# Patient Record
Sex: Female | Born: 1985 | Marital: Single | Smoking: Never smoker
Health system: Southern US, Community
[De-identification: ages and names within clinical notes are randomized; demographics above are authoritative.]

## PROBLEM LIST (undated history)

## (undated) ENCOUNTER — Inpatient Hospital Stay (HOSPITAL_COMMUNITY): Payer: Self-pay

## (undated) DIAGNOSIS — I10 Essential (primary) hypertension: Secondary | ICD-10-CM

## (undated) DIAGNOSIS — F419 Anxiety disorder, unspecified: Secondary | ICD-10-CM

## (undated) DIAGNOSIS — Z789 Other specified health status: Secondary | ICD-10-CM

## (undated) HISTORY — DX: Anxiety disorder, unspecified: F41.9

## (undated) HISTORY — PX: NO PAST SURGERIES: SHX2092

---

## 2016-08-22 LAB — OB RESULTS CONSOLE RPR: RPR: NONREACTIVE

## 2016-08-22 LAB — OB RESULTS CONSOLE GC/CHLAMYDIA
CHLAMYDIA, DNA PROBE: NEGATIVE
Gonorrhea: NEGATIVE

## 2016-08-22 LAB — OB RESULTS CONSOLE HEPATITIS B SURFACE ANTIGEN: HEP B S AG: NEGATIVE

## 2016-08-22 LAB — OB RESULTS CONSOLE HIV ANTIBODY (ROUTINE TESTING): HIV: NONREACTIVE

## 2016-08-22 LAB — OB RESULTS CONSOLE RUBELLA ANTIBODY, IGM: RUBELLA: IMMUNE

## 2016-12-09 ENCOUNTER — Encounter (HOSPITAL_COMMUNITY): Payer: Self-pay

## 2016-12-09 ENCOUNTER — Other Ambulatory Visit: Payer: Self-pay

## 2016-12-09 ENCOUNTER — Observation Stay (HOSPITAL_COMMUNITY)
Admission: AD | Admit: 2016-12-09 | Discharge: 2016-12-10 | Disposition: A | Discharge status: Home / Self Care | Payer: Commercial Managed Care - PPO | Source: Ambulatory Visit | Attending: Obstetrics and Gynecology | Admitting: Obstetrics and Gynecology

## 2016-12-09 DIAGNOSIS — O4702 False labor before 37 completed weeks of gestation, second trimester: Secondary | ICD-10-CM | POA: Diagnosis not present

## 2016-12-09 DIAGNOSIS — O2692 Pregnancy related conditions, unspecified, second trimester: Secondary | ICD-10-CM | POA: Diagnosis present

## 2016-12-09 DIAGNOSIS — Z8249 Family history of ischemic heart disease and other diseases of the circulatory system: Secondary | ICD-10-CM | POA: Diagnosis not present

## 2016-12-09 DIAGNOSIS — O26892 Other specified pregnancy related conditions, second trimester: Principal | ICD-10-CM | POA: Insufficient documentation

## 2016-12-09 DIAGNOSIS — Z833 Family history of diabetes mellitus: Secondary | ICD-10-CM | POA: Diagnosis not present

## 2016-12-09 DIAGNOSIS — Z3A23 23 weeks gestation of pregnancy: Secondary | ICD-10-CM | POA: Diagnosis not present

## 2016-12-09 HISTORY — DX: Other specified health status: Z78.9

## 2016-12-09 LAB — URINALYSIS, ROUTINE W REFLEX MICROSCOPIC
BILIRUBIN URINE: NEGATIVE
Glucose, UA: NEGATIVE mg/dL
HGB URINE DIPSTICK: NEGATIVE
KETONES UR: NEGATIVE mg/dL
NITRITE: NEGATIVE
PH: 6 (ref 5.0–8.0)
Protein, ur: NEGATIVE mg/dL
SPECIFIC GRAVITY, URINE: 1.014 (ref 1.005–1.030)
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
## 2016-12-10 ENCOUNTER — Observation Stay (HOSPITAL_COMMUNITY): Payer: Commercial Managed Care - PPO

## 2016-12-10 ENCOUNTER — Encounter (HOSPITAL_COMMUNITY): Payer: Self-pay | Admitting: *Deleted

## 2016-12-10 DIAGNOSIS — O4702 False labor before 37 completed weeks of gestation, second trimester: Secondary | ICD-10-CM | POA: Diagnosis not present

## 2016-12-10 DIAGNOSIS — O26892 Other specified pregnancy related conditions, second trimester: Secondary | ICD-10-CM | POA: Diagnosis not present

## 2016-12-10 MED ORDER — PRENATAL MULTIVITAMIN CH: 1.0000 | ORAL_TABLET | Freq: Every day | ORAL | Status: DC

## 2016-12-10 MED ORDER — DOCUSATE SODIUM 100 MG PO CAPS: 100.0000 mg | ORAL_CAPSULE | Freq: Every day | ORAL | Status: DC

## 2016-12-10 MED ORDER — ZOLPIDEM TARTRATE 5 MG PO TABS: 5.0000 mg | ORAL_TABLET | Freq: Every evening | ORAL | Status: DC | PRN

## 2016-12-10 MED ORDER — ACETAMINOPHEN 325 MG PO TABS: 650.0000 mg | ORAL_TABLET | ORAL | Status: DC | PRN

## 2016-12-10 MED ORDER — CALCIUM CARBONATE ANTACID 500 MG PO CHEW: 2.0000 | CHEWABLE_TABLET | ORAL | Status: DC | PRN

## 2016-12-10 NOTE — Progress Notes (Addendum)
   12/10/16 0236  Vital Signs  BP 97/70  BP Location Left Arm  Patient Position (if appropriate) Lying  BP Method Automatic  Pulse Rate 99  Pulse Rate Source Dinamap  Resp 18  Temp 97.9 F (36.6 C)  Temp Source Oral  Oxygen Therapy  SpO2 100 %  O2 Device Room Air     12/10/16 0236  Vital Signs  BP 97/70  BP Location Left Arm  Patient Position (if appropriate) Lying  BP Method Automatic  Pulse Rate 99  Pulse Rate Source Dinamap  Resp 18  Temp 97.9 F (36.6 C)  Temp Source Oral  Oxygen Therapy  SpO2 100 %  O2 Device Room Air  Received patient from MAU awake, alert and oriented x 4. Pt denies pain or discomfort. Pt oriented to room, call bell in close reach. Cont. EFM initiated.
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
## 2017-01-09 NOTE — L&D Delivery Note (Signed)
Delivery Note At 10:44 AM a viable female was delivered via  (Presentation:VTX?OA ;  ).  APGAR:9-9 , ; weight  .   Placenta statusspont, intact: , .  Cord:  with the following complications: .  Cord pH: not sent  Anesthesia:  loc Episiotomy:none   Lacerations:  R superficial periurethral Suture Repair: 3.0 vicryl rapide Est. Blood Loss (mL):  250  Mom to postpartum.  Baby to Couplet care / Skin to Skin.  [place] 03/14/2017, 11:00 AM

## 2017-03-06 LAB — OB RESULTS CONSOLE GBS: GBS: POSITIVE

## 2017-03-08 ENCOUNTER — Encounter (HOSPITAL_COMMUNITY): Payer: Self-pay | Admitting: Anesthesiology

## 2017-03-08 ENCOUNTER — Other Ambulatory Visit: Payer: Self-pay

## 2017-03-08 ENCOUNTER — Encounter (HOSPITAL_COMMUNITY): Payer: Self-pay

## 2017-03-08 ENCOUNTER — Inpatient Hospital Stay (HOSPITAL_COMMUNITY)
Admission: AD | Admit: 2017-03-08 | Discharge: 2017-03-10 | DRG: 833 | Disposition: A | Discharge status: Home / Self Care | Payer: Commercial Managed Care - PPO | Source: Ambulatory Visit | Attending: Obstetrics and Gynecology | Admitting: Obstetrics and Gynecology

## 2017-03-08 DIAGNOSIS — Z363 Encounter for antenatal screening for malformations: Secondary | ICD-10-CM

## 2017-03-08 DIAGNOSIS — O1490 Unspecified pre-eclampsia, unspecified trimester: Secondary | ICD-10-CM

## 2017-03-08 DIAGNOSIS — Z3A36 36 weeks gestation of pregnancy: Secondary | ICD-10-CM | POA: Diagnosis not present

## 2017-03-08 DIAGNOSIS — O1403 Mild to moderate pre-eclampsia, third trimester: Secondary | ICD-10-CM | POA: Diagnosis not present

## 2017-03-08 DIAGNOSIS — O1493 Unspecified pre-eclampsia, third trimester: Secondary | ICD-10-CM | POA: Diagnosis not present

## 2017-03-08 DIAGNOSIS — O99820 Streptococcus B carrier state complicating pregnancy: Secondary | ICD-10-CM | POA: Diagnosis present

## 2017-03-08 DIAGNOSIS — O09299 Supervision of pregnancy with other poor reproductive or obstetric history, unspecified trimester: Secondary | ICD-10-CM | POA: Diagnosis present

## 2017-03-08 HISTORY — DX: Supervision of pregnancy with other poor reproductive or obstetric history, unspecified trimester: O09.299

## 2017-03-08 LAB — COMPREHENSIVE METABOLIC PANEL
ALBUMIN: 2.9 g/dL — AB (ref 3.5–5.0)
ALT: 20 U/L (ref 14–54)
AST: 26 U/L (ref 15–41)
Alkaline Phosphatase: 190 U/L — ABNORMAL HIGH (ref 38–126)
Anion gap: 11 (ref 5–15)
BILIRUBIN TOTAL: 0.4 mg/dL (ref 0.3–1.2)
BUN: 8 mg/dL (ref 6–20)
CO2: 18 mmol/L — ABNORMAL LOW (ref 22–32)
CREATININE: 0.71 mg/dL (ref 0.44–1.00)
Calcium: 9.3 mg/dL (ref 8.9–10.3)
Chloride: 107 mmol/L (ref 101–111)
GFR calc Af Amer: >60 mL/min (ref >60)
GLUCOSE: 83 mg/dL (ref 65–99)
Potassium: 4 mmol/L (ref 3.5–5.1)
Sodium: 136 mmol/L (ref 135–145)
TOTAL PROTEIN: 6.9 g/dL (ref 6.5–8.1)

## 2017-03-08 LAB — URINALYSIS, ROUTINE W REFLEX MICROSCOPIC
Bilirubin Urine: NEGATIVE
Glucose, UA: NEGATIVE mg/dL
Hgb urine dipstick: NEGATIVE
Ketones, ur: NEGATIVE mg/dL
Leukocytes, UA: NEGATIVE
NITRITE: NEGATIVE
SPECIFIC GRAVITY, URINE: 1.026 (ref 1.005–1.030)
pH: 6 (ref 5.0–8.0)

## 2017-03-08 LAB — CBC
HCT: 33.8 % — ABNORMAL LOW (ref 36.0–46.0)
HEMOGLOBIN: 11.4 g/dL — AB (ref 12.0–15.0)
MCH: 27.8 pg (ref 26.0–34.0)
MCHC: 33.7 g/dL (ref 30.0–36.0)
MCV: 82.4 fL (ref 78.0–100.0)
Platelets: 197 10*3/uL (ref 150–400)
RBC: 4.1 MIL/uL (ref 3.87–5.11)
RDW: 13.8 % (ref 11.5–15.5)
WBC: 7.4 10*3/uL (ref 4.0–10.5)

## 2017-03-08 LAB — PROTEIN / CREATININE RATIO, URINE
CREATININE, URINE: 281 mg/dL
Protein Creatinine Ratio: 2.38 mg/mg{Cre} — ABNORMAL HIGH (ref 0.00–0.15)
Total Protein, Urine: 669 mg/dL

## 2017-03-08 LAB — TYPE AND SCREEN
ABO/RH(D): O POS
ANTIBODY SCREEN: NEGATIVE

## 2017-03-08 MED ORDER — MAGNESIUM SULFATE BOLUS VIA INFUSION
4.0000 g | Freq: Once | INTRAVENOUS | Status: AC
  Administered 2017-03-08: 4 g via INTRAVENOUS
  Filled 2017-03-08: qty 500

## 2017-03-08 MED ORDER — HYDRALAZINE HCL 20 MG/ML IJ SOLN: 10.0000 mg | Freq: Once | INTRAMUSCULAR | Status: DC | PRN

## 2017-03-08 MED ORDER — ACETAMINOPHEN 325 MG PO TABS: 650.0000 mg | ORAL_TABLET | ORAL | Status: DC | PRN

## 2017-03-08 MED ORDER — MAGNESIUM SULFATE 40 G IN LACTATED RINGERS - SIMPLE
2.0000 g/h | INTRAVENOUS | Status: DC
  Administered 2017-03-08: 2 g/h via INTRAVENOUS
  Filled 2017-03-08: qty 40

## 2017-03-08 MED ORDER — DOCUSATE SODIUM 100 MG PO CAPS
100.0000 mg | ORAL_CAPSULE | Freq: Every day | ORAL | Status: DC
  Administered 2017-03-09: 100 mg via ORAL
  Filled 2017-03-08: qty 1

## 2017-03-08 MED ORDER — BETAMETHASONE SOD PHOS & ACET 6 (3-3) MG/ML IJ SUSP
12.0000 mg | INTRAMUSCULAR | Status: AC
  Administered 2017-03-08 – 2017-03-09 (×2): 12 mg via INTRAMUSCULAR
  Filled 2017-03-08 (×2): qty 2

## 2017-03-08 MED ORDER — LACTATED RINGERS IV SOLN
INTRAVENOUS | Status: DC
  Administered 2017-03-08 – 2017-03-09 (×2): via INTRAVENOUS

## 2017-03-08 MED ORDER — CALCIUM CARBONATE ANTACID 500 MG PO CHEW: 2.0000 | CHEWABLE_TABLET | ORAL | Status: DC | PRN

## 2017-03-08 MED ORDER — PRENATAL MULTIVITAMIN CH
1.0000 | ORAL_TABLET | Freq: Every day | ORAL | Status: DC
  Administered 2017-03-09: 1 via ORAL
  Filled 2017-03-08: qty 1

## 2017-03-08 MED ORDER — LABETALOL HCL 5 MG/ML IV SOLN: 20.0000 mg | INTRAVENOUS | Status: DC | PRN

## 2017-03-08 NOTE — H&P (Signed)
Kathryn Barnes is a 32 y.o. G 1 P 0 at 45 w 1 day presented from office with hypertension, 4+ proteinuria and edema. Workup in MAU showed labile BPs and CBC and CMET normal.  Admitted and placed on Magnesium drip and steroid shot series initiated. OB History    Gravida Para Term Preterm AB Living   1             SAB TAB Ectopic Multiple Live Births                 Past Medical History:  Diagnosis Date  . Medical history non-contributory    Past Surgical History:  Procedure Laterality Date  . NO PAST SURGERIES     Family History: family history includes Diabetes in her maternal grandmother; Hypertension in her maternal grandmother. Social History:  reports that  has never smoked. she has never used smokeless tobacco. She reports that she does not drink alcohol or use drugs.     Maternal Diabetes: No Genetic Screening: Normal Maternal Ultrasounds/Referrals: Normal Fetal Ultrasounds or other Referrals:  None Maternal Substance Abuse:  No Significant Maternal Medications:  None Significant Maternal Lab Results:  None Other Comments:  None  Review of Systems  All other systems reviewed and are negative.  History   Blood pressure (!) 137/92, pulse 69, temperature 97.8 F (36.6 C), temperature source Oral, resp. rate 18, height 5' 4.5" (1.638 m), weight 77.1 kg (170 lb), SpO2 100 %. Maternal Exam:  Abdomen: Fetal presentation: vertex     Fetal Exam Fetal State Assessment: Category I - tracings are normal.     Physical Exam  Nursing note and vitals reviewed. Constitutional: She appears well-developed.  HENT:  Head: Normocephalic.  Eyes: Pupils are equal, round, and reactive to light.  Neck: Normal range of motion.  Cardiovascular: Normal rate and regular rhythm.  Respiratory: Effort normal.  GI: Soft.  Genitourinary: Vagina normal.    Prenatal labs: ABO, Rh:   Antibody:   Rubella:   RPR:    HBsAg:    HIV:    GBS:     Assessment/Plan: IUP at 36 w 1  day Preeclampsia Magnesium drip  Repeat CBC and CMET in am Complete steroid series and probable induction after that    [place] L 03/08/2017, 7:46 PM

## 2017-03-08 NOTE — MAU Note (Signed)
Pt sent over from office for elevated b/p and 4+protien in Urine.Reports she has had spots and blurred vision through out her pregnancy . Denies H/a or dizzyness.

## 2017-03-08 NOTE — MAU Provider Note (Signed)
History     CSN: [id number]  Arrival date and time: 03/08/17 1600   First Provider Initiated Contact with Patient 03/08/17 1720      Chief Complaint  Patient presents with  . Hypertension   G1 @36 .1 wks sent from office for elevated BP and proteinuria. She denies HA, visual disturbances, and epigastric pain. Reports good FM. No VB, LOF, or ctx.   OB History    Gravida Para Term Preterm AB Living   1             SAB TAB Ectopic Multiple Live Births                  Past Medical History:  Diagnosis Date  . Medical history non-contributory     Past Surgical History:  Procedure Laterality Date  . NO PAST SURGERIES      Family History  Problem Relation Age of Onset  . Diabetes Maternal Grandmother   . Hypertension Maternal Grandmother     Social History   Tobacco Use  . Smoking status: Never Smoker  . Smokeless tobacco: Never Used  Substance Use Topics  . Alcohol use: No    Frequency: Never  . Drug use: No    Allergies:  Allergies  Allergen Reactions  . Robaxin [Methocarbamol] Hives    Medications Prior to Admission  Medication Sig Dispense Refill Last Dose  . Prenatal Vit-Fe Fumarate-FA (MULTIVITAMIN-PRENATAL) 27-0.8 MG TABS tablet Take 1 tablet by mouth daily at 12 noon.   Past Week at Unknown time    Review of Systems  Eyes: Negative for visual disturbance.  Gastrointestinal: Negative for abdominal pain.  Genitourinary: Negative for vaginal bleeding.  Neurological: Negative for headaches.   Physical Exam   Blood pressure (!) 133/92, pulse 73, temperature 98.6 F (37 C), temperature source Oral, resp. rate 18, height 5' 4.5" (1.638 m), weight 170 lb (77.1 kg), SpO2 100 %. Patient Vitals for the past 24 hrs:  BP Temp Temp src Pulse Resp SpO2 Height Weight  03/08/17 1800 (!) 133/92 - - 73 - 100 % - -  03/08/17 1745 (!) 148/93 - - 63 - - - -  03/08/17 1731 (!) 139/95 - - 68 - - - -  03/08/17 1715 (!) 151/96 - - 78 - - - -  03/08/17 1700 (!)  136/97 - - 77 - - - -  03/08/17 1651 (!) 127/91 - - 70 - - - -  03/08/17 1640 (!) 125/93 - - 73 - - - -  03/08/17 1616 (!) 161/99 98.6 F (37 C) Oral 68 18 - 5' 4.5" (1.638 m) 170 lb (77.1 kg)    Physical Exam  Constitutional: She is oriented to person, place, and time. She appears well-developed and well-nourished. No distress.  HENT:  Head: Normocephalic and atraumatic.  Neck: Normal range of motion.  Cardiovascular: Normal rate.  Respiratory: Effort normal. No respiratory distress.  Musculoskeletal: Normal range of motion.  Neurological: She is alert and oriented to person, place, and time.  Psychiatric: She has a normal mood and affect.  EFM: 155 bpm, mod variability, + accels, no decels Toco: rare  Results for orders placed or performed during the hospital encounter of 03/08/17 (from the past 24 hour(s))  Urinalysis, Routine w reflex microscopic     Status: Abnormal   Collection Time: 03/08/17  4:11 PM  Result Value Ref Range   Color, Urine YELLOW YELLOW   APPearance HAZY (A) CLEAR   Specific Gravity, Urine 1.026 1.005 -  1.030   pH 6.0 5.0 - 8.0   Glucose, UA NEGATIVE NEGATIVE mg/dL   Hgb urine dipstick NEGATIVE NEGATIVE   Bilirubin Urine NEGATIVE NEGATIVE   Ketones, ur NEGATIVE NEGATIVE mg/dL   Protein, ur >=300 (A) NEGATIVE mg/dL   Nitrite NEGATIVE NEGATIVE   Leukocytes, UA NEGATIVE NEGATIVE   RBC / HPF 0-5 0 - 5 RBC/hpf   WBC, UA 0-5 0 - 5 WBC/hpf   Bacteria, UA RARE (A) NONE SEEN   Squamous Epithelial / LPF 6-30 (A) NONE SEEN   Mucus PRESENT   Protein / creatinine ratio, urine     Status: Abnormal   Collection Time: 03/08/17  4:11 PM  Result Value Ref Range   Creatinine, Urine 281.00 mg/dL   Total Protein, Urine 669 mg/dL   Protein Creatinine Ratio 2.38 (H) 0.00 - 0.15 mg/mg[Cre]  CBC     Status: Abnormal   Collection Time: 03/08/17  5:07 PM  Result Value Ref Range   WBC 7.4 4.0 - 10.5 K/uL   RBC 4.10 3.87 - 5.11 MIL/uL   Hemoglobin 11.4 (L) 12.0 - 15.0  g/dL   HCT 33.8 (L) 36.0 - 46.0 %   MCV 82.4 78.0 - 100.0 fL   MCH 27.8 26.0 - 34.0 pg   MCHC 33.7 30.0 - 36.0 g/dL   RDW 13.8 11.5 - 15.5 %   Platelets 197 150 - 400 K/uL  Comprehensive metabolic panel     Status: Abnormal   Collection Time: 03/08/17  5:07 PM  Result Value Ref Range   Sodium 136 135 - 145 mmol/L   Potassium 4.0 3.5 - 5.1 mmol/L   Chloride 107 101 - 111 mmol/L   CO2 18 (L) 22 - 32 mmol/L   Glucose, Bld 83 65 - 99 mg/dL   BUN 8 6 - 20 mg/dL   Creatinine, Ser 0.71 0.44 - 1.00 mg/dL   Calcium 9.3 8.9 - 10.3 mg/dL   Total Protein 6.9 6.5 - 8.1 g/dL   Albumin 2.9 (L) 3.5 - 5.0 g/dL   AST 26 15 - 41 U/L   ALT 20 14 - 54 U/L   Alkaline Phosphatase 190 (H) 38 - 126 U/L   Total Bilirubin 0.4 0.3 - 1.2 mg/dL   GFR calc non Af Amer >60 >60 mL/min   GFR calc Af Amer >60 >60 mL/min   Anion gap 11 5 - 15   MAU Course  Procedures  MDM Labs ordered and reviewed. Presentation and clinical findings discussed with Dr. Helane Rima admit to Ante, Mag Sulfate, and BMZ.   Assessment and Plan   1. Preeclampsia, third trimester    Admit to [facility] per Dr. Lilli Light, CNM 03/08/2017, 6:37 PM

## 2017-03-09 ENCOUNTER — Inpatient Hospital Stay (HOSPITAL_COMMUNITY): Payer: Commercial Managed Care - PPO

## 2017-03-09 LAB — COMPREHENSIVE METABOLIC PANEL
ALBUMIN: 2.7 g/dL — AB (ref 3.5–5.0)
ALK PHOS: 180 U/L — AB (ref 38–126)
ALT: 22 U/L (ref 14–54)
ANION GAP: 9 (ref 5–15)
AST: 23 U/L (ref 15–41)
BILIRUBIN TOTAL: 0.5 mg/dL (ref 0.3–1.2)
BUN: 7 mg/dL (ref 6–20)
CALCIUM: 8.1 mg/dL — AB (ref 8.9–10.3)
CO2: 18 mmol/L — AB (ref 22–32)
CREATININE: 0.61 mg/dL (ref 0.44–1.00)
Chloride: 106 mmol/L (ref 101–111)
GFR calc Af Amer: >60 mL/min (ref >60)
GFR calc non Af Amer: >60 mL/min (ref >60)
GLUCOSE: 105 mg/dL — AB (ref 65–99)
Potassium: 4.5 mmol/L (ref 3.5–5.1)
Sodium: 133 mmol/L — ABNORMAL LOW (ref 135–145)
TOTAL PROTEIN: 6.7 g/dL (ref 6.5–8.1)

## 2017-03-09 LAB — CBC
HCT: 32 % — ABNORMAL LOW (ref 36.0–46.0)
Hemoglobin: 10.8 g/dL — ABNORMAL LOW (ref 12.0–15.0)
MCH: 27.7 pg (ref 26.0–34.0)
MCHC: 33.8 g/dL (ref 30.0–36.0)
MCV: 82.1 fL (ref 78.0–100.0)
Platelets: 223 10*3/uL (ref 150–400)
RBC: 3.9 MIL/uL (ref 3.87–5.11)
RDW: 13.8 % (ref 11.5–15.5)
WBC: 9.4 10*3/uL (ref 4.0–10.5)

## 2017-03-09 LAB — ABO/RH: ABO/RH(D): O POS

## 2017-03-09 NOTE — Plan of Care (Signed)
Patient is returning from MFM via wheelchair. Patient denies any pain or discomfort at this time. Patient also denies any vaginal discharge or leakage at this time. Patient has been placed on external fetal monitor for continuous fetal monitoring per Physician's orders.

## 2017-03-09 NOTE — Progress Notes (Signed)
32 yo G1 @ 36+2 wks admitted with preeclampsia.  Pt denies HA, n/v, & visual changes.  Reports good FM  AF, VSS BP 120s - 130s/80s-90s Gen - NAD Abd - gravid, NT Ext - no edema, DTR 1+ Cvx 2cm (on exam in office yesterday)  Results for orders placed or performed during the hospital encounter of 03/08/17 (from the past 24 hour(s))  Urinalysis, Routine w reflex microscopic     Status: Abnormal   Collection Time: 03/08/17  4:11 PM  Result Value Ref Range   Color, Urine YELLOW YELLOW   APPearance HAZY (A) CLEAR   Specific Gravity, Urine 1.026 1.005 - 1.030   pH 6.0 5.0 - 8.0   Glucose, UA NEGATIVE NEGATIVE mg/dL   Hgb urine dipstick NEGATIVE NEGATIVE   Bilirubin Urine NEGATIVE NEGATIVE   Ketones, ur NEGATIVE NEGATIVE mg/dL   Protein, ur >=300 (A) NEGATIVE mg/dL   Nitrite NEGATIVE NEGATIVE   Leukocytes, UA NEGATIVE NEGATIVE   RBC / HPF 0-5 0 - 5 RBC/hpf   WBC, UA 0-5 0 - 5 WBC/hpf   Bacteria, UA RARE (A) NONE SEEN   Squamous Epithelial / LPF 6-30 (A) NONE SEEN   Mucus PRESENT   Protein / creatinine ratio, urine     Status: Abnormal   Collection Time: 03/08/17  4:11 PM  Result Value Ref Range   Creatinine, Urine 281.00 mg/dL   Total Protein, Urine 669 mg/dL   Protein Creatinine Ratio 2.38 (H) 0.00 - 0.15 mg/mg[Cre]  CBC     Status: Abnormal   Collection Time: 03/08/17  5:07 PM  Result Value Ref Range   WBC 7.4 4.0 - 10.5 K/uL   RBC 4.10 3.87 - 5.11 MIL/uL   Hemoglobin 11.4 (L) 12.0 - 15.0 g/dL   HCT 33.8 (L) 36.0 - 46.0 %   MCV 82.4 78.0 - 100.0 fL   MCH 27.8 26.0 - 34.0 pg   MCHC 33.7 30.0 - 36.0 g/dL   RDW 13.8 11.5 - 15.5 %   Platelets 197 150 - 400 K/uL  Comprehensive metabolic panel     Status: Abnormal   Collection Time: 03/08/17  5:07 PM  Result Value Ref Range   Sodium 136 135 - 145 mmol/L   Potassium 4.0 3.5 - 5.1 mmol/L   Chloride 107 101 - 111 mmol/L   CO2 18 (L) 22 - 32 mmol/L   Glucose, Bld 83 65 - 99 mg/dL   BUN 8 6 - 20 mg/dL   Creatinine, Ser 0.71  0.44 - 1.00 mg/dL   Calcium 9.3 8.9 - 10.3 mg/dL   Total Protein 6.9 6.5 - 8.1 g/dL   Albumin 2.9 (L) 3.5 - 5.0 g/dL   AST 26 15 - 41 U/L   ALT 20 14 - 54 U/L   Alkaline Phosphatase 190 (H) 38 - 126 U/L   Total Bilirubin 0.4 0.3 - 1.2 mg/dL   GFR calc non Af Amer >60 >60 mL/min   GFR calc Af Amer >60 >60 mL/min   Anion gap 11 5 - 15  Type and screen [place]     Status: None   Collection Time: 03/08/17  7:27 PM  Result Value Ref Range   ABO/RH(D) O POS    Antibody Screen NEG    Sample Expiration      03/11/2017 Performed at [facility], [address] [place], [place] [address]   ABO/Rh     Status: None   Collection Time: 03/08/17  7:27 PM  Result Value Ref Range   ABO/RH(D)      O POS Performed at [facility], [address], [place], [place] [address]   CBC     Status: Abnormal   Collection Time: 03/09/17  5:23 AM  Result Value Ref Range   WBC 9.4 4.0 - 10.5 K/uL   RBC 3.90 3.87 - 5.11 MIL/uL   Hemoglobin 10.8 (L) 12.0 - 15.0 g/dL   HCT 32.0 (L) 36.0 - 46.0 %   MCV 82.1 78.0 - 100.0 fL   MCH 27.7 26.0 - 34.0 pg   MCHC 33.8 30.0 - 36.0 g/dL   RDW 13.8 11.5 - 15.5 %   Platelets 223 150 - 400 K/uL  Comprehensive metabolic panel     Status: Abnormal   Collection Time: 03/09/17  5:23 AM  Result Value Ref Range   Sodium 133 (L) 135 - 145 mmol/L   Potassium 4.5 3.5 - 5.1 mmol/L   Chloride 106 101 - 111 mmol/L   CO2 18 (L) 22 - 32 mmol/L   Glucose, Bld 105 (H) 65 - 99 mg/dL   BUN 7 6 - 20 mg/dL   Creatinine, Ser 0.61 0.44 - 1.00 mg/dL   Calcium 8.1 (L) 8.9 - 10.3 mg/dL   Total Protein 6.7 6.5 - 8.1 g/dL   Albumin 2.7 (L) 3.5 - 5.0 g/dL   AST 23 15 - 41 U/L   ALT 22 14 - 54 U/L   Alkaline Phosphatase 180 (H) 38 - 126 U/L   Total Bilirubin 0.5 0.3 - 1.2 mg/dL   GFR calc non Af Amer >60 >60 mL/min   GFR calc Af Amer >60 >60 mL/min   Anion gap 9 5 - 15    A/P:  36+2 weeks admitted with preeclampsia 1. BMZ #2 at 7pm 2.  S/p Mag x 12  hrs, will discontinue and monitor BP today 3. Saline lock IV 4. [place] for EFW/AFI today - MFM consult to discuss delivery after steroids vs 37wks

## 2017-03-09 NOTE — Consult Note (Signed)
Maternal Fetal Medicine Consultation  I had the pleasure of seeing your patient Kathryn Barnes for Maternal-Fetal Medicine consultation on 03/08/2017. As you know, Kathryn Barnes is a 32 y.o. G1P0 at [redacted]w[redacted]d who presents for consultation regarding preeclampsia without severe features.  Kathryn Barnes was sent from the office for elevated blood pressures and proteinuria. On arrival to the MAU she was noted to have an isolated severe range blood pressure with all subsequent blood pressures in the mild range. P/C was elevated at 2.38. Creatinine, LFTs and platelets were within normal limits. She denied symptoms of preeclampsia and fetal status was reassuring. She was admitted for monitoring, BMZ and magnesium.   Since admission blood pressures have been in the mild range. Labs are stable. Magnesium was discontinued. Today she feels well and denies headache, vision change, chest pain, shortness of breath, right upper quadrant pain, or edema.  Ultrasound today shows a well grown fetus in the 41%ile with normal amniotic fluid volume. Anatomy is limited due to advanced gestational age but no gross anomalies noted. Please see separate document for fetal ultrasound report.  Kathryn Barnes has not significant medical or surgical history. She takes prenatal vitamins and is allergic to robaxin. Kathryn Barnes denies alcohol, tobacco or other drug use. Her family history is noncontributory.  Assessment: I do not have the outpatient records available for review. At this time given only one documented severe range blood pressure and no other severe features of preeclampsia, does not meet criteria for preeclampsia with severe features. If BP in the office was severe then would be considered preeclampsia with severe features.  Recommendation: If preeclampsia without severe features, recommend expectant management until 37 weeks with low threshold to proceed with induction if severe features or other indications for delivery  develop. If outpatient records confirm second severe range blood pressure, would proceed with induction now without waiting for completion of steroid course.   Thank you for the opportunity to be a part of the care of [facility] Please contact our office if we can be of further assistance.   Level 3  Kathryn Sander, MD Maternal-Fetal Medicine

## 2017-03-10 NOTE — Progress Notes (Signed)
Pt w/out complaint.  No HA, n/v, or RUQ pain.  Good FM.  AF, VSS BP 120-140/80s Gen - NAD Abd - gravid, NT Ext - no clonus, DTR 1+  A/P:  36+3 wks with mild pre-eclampsia Plan for d/c home on rest and return for IOL at 37wks Strict precautions reviewed

## 2017-03-10 NOTE — Progress Notes (Signed)

## 2017-03-10 NOTE — Discharge Summary (Signed)
Physician Discharge Summary  Patient ID: Kathryn Barnes MRN: [id number] DOB/AGE: 1985/10/11 32 y.o.  Admit date: 03/08/2017 Discharge date: 03/10/2017  Admission Diagnoses: preeclampsia  Discharge Diagnoses:  Active Problems:   Preeclampsia, third trimester   Discharged Condition: stable  Hospital Course: Pt admitted with elevated BP and proteinuria.  Received BMZ series and BP remained in mild-normal range.  Pt is without symptoms and felt to be stable for discharge.  Consults: MFM  Significant Diagnostic Studies: labs: cbc,cmet, UA  Treatments: BMZ, ultrasound  Discharge Exam: Blood pressure 122/83, pulse 74, temperature 98.5 F (36.9 C), temperature source Oral, resp. rate 18, height 5' 4.5" (1.638 m), weight 170 lb (77.1 kg), SpO2 100 %. General appearance: alert and cooperative GI: normal findings: gravid, NT Extremities: extremities normal, atraumatic, no cyanosis or edema  Disposition: 01-Home or Self Care   Allergies as of 03/10/2017      Reactions   Robaxin [methocarbamol] Hives      Medication List    TAKE these medications   aspirin-sod bicarb-citric acid 325 MG Tbef tablet Commonly known as:  ALKA-SELTZER Take 325 mg by mouth every 6 (six) hours as needed (upset stomach).   multivitamin-prenatal 27-0.8 MG Tabs tablet Take 1 tablet by mouth daily at 12 noon.   ROLAIDS ADVANCED 1000-200-40 MG Chew Generic drug:  Cal Carb-Mag Hydrox-Simeth Chew 1 tablet by mouth every 6 (six) hours as needed (heart burn).        Signed: Marylynn Pearson 03/10/2017, 8:14 AM

## 2017-03-12 ENCOUNTER — Telehealth (HOSPITAL_COMMUNITY): Payer: Self-pay | Admitting: *Deleted

## 2017-03-12 ENCOUNTER — Encounter (HOSPITAL_COMMUNITY): Payer: Self-pay | Admitting: *Deleted

## 2017-03-12 NOTE — Telephone Encounter (Signed)
Preadmission screen  

## 2017-03-13 ENCOUNTER — Inpatient Hospital Stay (HOSPITAL_COMMUNITY)
Admission: AD | Admit: 2017-03-13 | Discharge: 2017-03-16 | DRG: 807 | Disposition: A | Discharge status: Home / Self Care | Payer: Commercial Managed Care - PPO | Source: Ambulatory Visit | Attending: Obstetrics and Gynecology | Admitting: Obstetrics and Gynecology

## 2017-03-13 ENCOUNTER — Encounter (HOSPITAL_COMMUNITY): Payer: Self-pay

## 2017-03-13 DIAGNOSIS — O1494 Unspecified pre-eclampsia, complicating childbirth: Principal | ICD-10-CM | POA: Diagnosis present

## 2017-03-13 DIAGNOSIS — R030 Elevated blood-pressure reading, without diagnosis of hypertension: Secondary | ICD-10-CM | POA: Diagnosis present

## 2017-03-13 DIAGNOSIS — Z3490 Encounter for supervision of normal pregnancy, unspecified, unspecified trimester: Secondary | ICD-10-CM

## 2017-03-13 DIAGNOSIS — Z3A37 37 weeks gestation of pregnancy: Secondary | ICD-10-CM

## 2017-03-13 LAB — COMPREHENSIVE METABOLIC PANEL
ALT: 24 U/L (ref 14–54)
AST: 21 U/L (ref 15–41)
Albumin: 2.8 g/dL — ABNORMAL LOW (ref 3.5–5.0)
Alkaline Phosphatase: 166 U/L — ABNORMAL HIGH (ref 38–126)
Anion gap: 7 (ref 5–15)
BUN: 11 mg/dL (ref 6–20)
CALCIUM: 9 mg/dL (ref 8.9–10.3)
CHLORIDE: 108 mmol/L (ref 101–111)
CO2: 23 mmol/L (ref 22–32)
CREATININE: 0.64 mg/dL (ref 0.44–1.00)
GFR calc Af Amer: >60 mL/min (ref >60)
GFR calc non Af Amer: >60 mL/min (ref >60)
GLUCOSE: 99 mg/dL (ref 65–99)
Potassium: 4 mmol/L (ref 3.5–5.1)
SODIUM: 138 mmol/L (ref 135–145)
Total Bilirubin: 0.5 mg/dL (ref 0.3–1.2)
Total Protein: 6.9 g/dL (ref 6.5–8.1)

## 2017-03-13 LAB — CBC
HCT: 32.3 % — ABNORMAL LOW (ref 36.0–46.0)
Hemoglobin: 11 g/dL — ABNORMAL LOW (ref 12.0–15.0)
MCH: 27.7 pg (ref 26.0–34.0)
MCHC: 34.1 g/dL (ref 30.0–36.0)
MCV: 81.4 fL (ref 78.0–100.0)
PLATELETS: 210 10*3/uL (ref 150–400)
RBC: 3.97 MIL/uL (ref 3.87–5.11)
RDW: 13.8 % (ref 11.5–15.5)
WBC: 10.5 10*3/uL (ref 4.0–10.5)

## 2017-03-13 MED ORDER — ACETAMINOPHEN 325 MG PO TABS: 650.0000 mg | ORAL_TABLET | ORAL | Status: DC | PRN

## 2017-03-13 MED ORDER — LACTATED RINGERS IV SOLN
INTRAVENOUS | Status: DC
  Administered 2017-03-13 – 2017-03-14 (×2): via INTRAVENOUS

## 2017-03-13 MED ORDER — SOD CITRATE-CITRIC ACID 500-334 MG/5ML PO SOLN: 30.0000 mL | ORAL | Status: DC | PRN

## 2017-03-13 MED ORDER — LIDOCAINE HCL (PF) 1 % IJ SOLN
30.0000 mL | INTRAMUSCULAR | Status: AC | PRN
  Administered 2017-03-14: 30 mL via SUBCUTANEOUS
  Filled 2017-03-13: qty 30

## 2017-03-13 MED ORDER — OXYCODONE-ACETAMINOPHEN 5-325 MG PO TABS: 1.0000 | ORAL_TABLET | ORAL | Status: DC | PRN

## 2017-03-13 MED ORDER — PENICILLIN G POT IN DEXTROSE 60000 UNIT/ML IV SOLN
3.0000 10*6.[IU] | INTRAVENOUS | Status: DC
  Administered 2017-03-14 (×2): 3 10*6.[IU] via INTRAVENOUS
  Filled 2017-03-13 (×6): qty 50

## 2017-03-13 MED ORDER — OXYTOCIN 40 UNITS IN LACTATED RINGERS INFUSION - SIMPLE MED
2.5000 [IU]/h | INTRAVENOUS | Status: DC
  Administered 2017-03-14: 2.5 [IU]/h via INTRAVENOUS

## 2017-03-13 MED ORDER — OXYCODONE-ACETAMINOPHEN 5-325 MG PO TABS: 2.0000 | ORAL_TABLET | ORAL | Status: DC | PRN

## 2017-03-13 MED ORDER — FLEET ENEMA 7-19 GM/118ML RE ENEM: 1.0000 | ENEMA | RECTAL | Status: DC | PRN

## 2017-03-13 MED ORDER — SODIUM CHLORIDE 0.9 % IV SOLN
5.0000 10*6.[IU] | Freq: Once | INTRAVENOUS | Status: AC
  Administered 2017-03-13: 5 10*6.[IU] via INTRAVENOUS
  Filled 2017-03-13: qty 5

## 2017-03-13 MED ORDER — ONDANSETRON HCL 4 MG/2ML IJ SOLN: 4.0000 mg | Freq: Four times a day (QID) | INTRAMUSCULAR | Status: DC | PRN

## 2017-03-13 MED ORDER — FENTANYL CITRATE (PF) 100 MCG/2ML IJ SOLN: 50.0000 ug | INTRAMUSCULAR | Status: DC | PRN

## 2017-03-13 MED ORDER — TERBUTALINE SULFATE 1 MG/ML IJ SOLN: 0.2500 mg | Freq: Once | INTRAMUSCULAR | Status: DC | PRN

## 2017-03-13 MED ORDER — LACTATED RINGERS IV SOLN: 500.0000 mL | INTRAVENOUS | Status: DC | PRN

## 2017-03-13 MED ORDER — OXYTOCIN BOLUS FROM INFUSION
500.0000 mL | Freq: Once | INTRAVENOUS | Status: AC
  Administered 2017-03-14: 500 mL via INTRAVENOUS

## 2017-03-13 MED ORDER — MISOPROSTOL 25 MCG QUARTER TABLET
25.0000 ug | ORAL_TABLET | ORAL | Status: DC | PRN
  Administered 2017-03-14 (×2): 25 ug via VAGINAL
  Filled 2017-03-13 (×3): qty 1

## 2017-03-13 NOTE — H&P (Deleted)
  The note originally documented on this encounter has been moved the the encounter in which it belongs.  

## 2017-03-13 NOTE — H&P (Signed)
Kathryn Barnes is a 32 y.o. female presenting for IOL w/unfavorable cervix s/s pre-eclampsia without severe features. Was admitted 2/28-3/1 for elevated BPs and found to have PIH. Got BMZ and is now here for IOL @ 37.0wga.   OB History    Gravida Para Term Preterm AB Living   1             SAB TAB Ectopic Multiple Live Births                 Past Medical History:  Diagnosis Date  . Anxiety    no recent panic attacks  . Medical history non-contributory    Past Surgical History:  Procedure Laterality Date  . NO PAST SURGERIES     Family History: family history includes Alcohol abuse in her maternal grandfather; Anemia in her mother; Anxiety disorder in her maternal aunt; Diabetes in her maternal grandmother; Hypertension in her maternal grandmother; Stroke in her maternal grandmother. Social History:  reports that  has never smoked. she has never used smokeless tobacco. She reports that she does not drink alcohol or use drugs.     Maternal Diabetes: No Genetic Screening: Declined Maternal Ultrasounds/Referrals: Normal Fetal Ultrasounds or other Referrals:  None Maternal Substance Abuse:  No Significant Maternal Medications:  None Significant Maternal Lab Results:  None Other Comments:  None  ROS History   There were no vitals taken for this visit. Exam Physical Exam  (from clinic) NAD, A&O NWOB Abd soft, nondistended, gravid SVE 2/lg/-2  Prenatal labs: ABO, Rh: --/--/O POS, O POS Performed at [facility], [address], [place], [place] [address]  [id number]/28 1927) Antibody: NEG (02/28 1927) Rubella:   RPR:    HBsAg:    HIV:    GBS:     Assessment/Plan: 32yo G1P0 @ 37.0 wga presenting for IOL s/s PIH w/out severe features.  UPC 2.38, otherwise PIH labs WNL ~ 1 week ago.  Plan cytotec followed by pitocin/AROM when more favorable.  GBS pos - PCN per protocol.     Tyson Dense 03/13/2017, 7:37 PM

## 2017-03-14 ENCOUNTER — Inpatient Hospital Stay (HOSPITAL_COMMUNITY)
Admit: 2017-03-14 | Discharge: 2017-03-14 | Disposition: A | Discharge status: Home / Self Care | Payer: Commercial Managed Care - PPO | Attending: Obstetrics and Gynecology | Admitting: Obstetrics and Gynecology

## 2017-03-14 ENCOUNTER — Encounter (HOSPITAL_COMMUNITY): Payer: Self-pay | Admitting: *Deleted

## 2017-03-14 ENCOUNTER — Other Ambulatory Visit: Payer: Self-pay

## 2017-03-14 LAB — TYPE AND SCREEN
ABO/RH(D): O POS
ANTIBODY SCREEN: NEGATIVE

## 2017-03-14 LAB — RPR: RPR Ser Ql: NONREACTIVE

## 2017-03-14 MED ORDER — ZOLPIDEM TARTRATE 5 MG PO TABS: 5.0000 mg | ORAL_TABLET | Freq: Every evening | ORAL | Status: DC | PRN

## 2017-03-14 MED ORDER — ACETAMINOPHEN 325 MG PO TABS
650.0000 mg | ORAL_TABLET | ORAL | Status: DC | PRN
  Administered 2017-03-14: 650 mg via ORAL
  Filled 2017-03-14: qty 2

## 2017-03-14 MED ORDER — BISACODYL 10 MG RE SUPP: 10.0000 mg | Freq: Every day | RECTAL | Status: DC | PRN

## 2017-03-14 MED ORDER — SENNOSIDES-DOCUSATE SODIUM 8.6-50 MG PO TABS
2.0000 | ORAL_TABLET | ORAL | Status: DC
  Administered 2017-03-14 – 2017-03-15 (×2): 2 via ORAL
  Filled 2017-03-14 (×2): qty 2

## 2017-03-14 MED ORDER — BUTORPHANOL TARTRATE 1 MG/ML IJ SOLN: 1.0000 mg | Freq: Once | INTRAMUSCULAR | Status: DC

## 2017-03-14 MED ORDER — PRENATAL MULTIVITAMIN CH
1.0000 | ORAL_TABLET | Freq: Every day | ORAL | Status: DC
Start: 2017-03-14 — End: 2017-03-16
  Administered 2017-03-15: 1 via ORAL
  Filled 2017-03-14: qty 1

## 2017-03-14 MED ORDER — IBUPROFEN 800 MG PO TABS
800.0000 mg | ORAL_TABLET | Freq: Three times a day (TID) | ORAL | Status: DC | PRN
  Administered 2017-03-14 – 2017-03-15 (×3): 800 mg via ORAL
  Filled 2017-03-14 (×3): qty 1

## 2017-03-14 MED ORDER — HYDRALAZINE HCL 20 MG/ML IJ SOLN: 10.0000 mg | Freq: Once | INTRAMUSCULAR | Status: DC | PRN

## 2017-03-14 MED ORDER — OXYTOCIN 40 UNITS IN LACTATED RINGERS INFUSION - SIMPLE MED
1.0000 m[IU]/min | INTRAVENOUS | Status: DC
  Administered 2017-03-14: 2 m[IU]/min via INTRAVENOUS
  Filled 2017-03-14: qty 1000

## 2017-03-14 MED ORDER — BUTORPHANOL TARTRATE 1 MG/ML IJ SOLN
INTRAMUSCULAR | Status: AC
  Administered 2017-03-14: 1 mg
  Filled 2017-03-14: qty 1

## 2017-03-14 MED ORDER — LABETALOL HCL 5 MG/ML IV SOLN
20.0000 mg | INTRAVENOUS | Status: DC | PRN
  Administered 2017-03-14: 20 mg via INTRAVENOUS
  Administered 2017-03-14: 40 mg via INTRAVENOUS
  Filled 2017-03-14: qty 8

## 2017-03-14 MED ORDER — MAGNESIUM SULFATE 40 G IN LACTATED RINGERS - SIMPLE
2.0000 g/h | INTRAVENOUS | Status: DC
  Administered 2017-03-14 – 2017-03-15 (×2): 2 g/h via INTRAVENOUS
  Filled 2017-03-14 (×2): qty 40

## 2017-03-14 MED ORDER — COCONUT OIL OIL
1.0000 "application " | TOPICAL_OIL | Status: DC | PRN
  Administered 2017-03-14: 1 via TOPICAL
  Filled 2017-03-14: qty 120

## 2017-03-14 MED ORDER — DIPHENHYDRAMINE HCL 25 MG PO CAPS: 25.0000 mg | ORAL_CAPSULE | Freq: Four times a day (QID) | ORAL | Status: DC | PRN

## 2017-03-14 MED ORDER — DIBUCAINE 1 % RE OINT: 1.0000 "application " | TOPICAL_OINTMENT | RECTAL | Status: DC | PRN

## 2017-03-14 MED ORDER — LABETALOL HCL 5 MG/ML IV SOLN
INTRAVENOUS | Status: AC
  Filled 2017-03-14: qty 4

## 2017-03-14 MED ORDER — OXYCODONE-ACETAMINOPHEN 5-325 MG PO TABS: 2.0000 | ORAL_TABLET | ORAL | Status: DC | PRN

## 2017-03-14 MED ORDER — BENZOCAINE-MENTHOL 20-0.5 % EX AERO
1.0000 "application " | INHALATION_SPRAY | CUTANEOUS | Status: DC | PRN
  Administered 2017-03-14: 1 via TOPICAL

## 2017-03-14 MED ORDER — ONDANSETRON HCL 4 MG/2ML IJ SOLN: 4.0000 mg | INTRAMUSCULAR | Status: DC | PRN

## 2017-03-14 MED ORDER — FLEET ENEMA 7-19 GM/118ML RE ENEM: 1.0000 | ENEMA | Freq: Every day | RECTAL | Status: DC | PRN

## 2017-03-14 MED ORDER — OXYCODONE-ACETAMINOPHEN 5-325 MG PO TABS: 1.0000 | ORAL_TABLET | ORAL | Status: DC | PRN

## 2017-03-14 MED ORDER — MEASLES, MUMPS & RUBELLA VAC ~~LOC~~ INJ
0.5000 mL | INJECTION | Freq: Once | SUBCUTANEOUS | Status: DC
  Filled 2017-03-14: qty 0.5

## 2017-03-14 MED ORDER — LACTATED RINGERS IV SOLN: INTRAVENOUS | Status: DC

## 2017-03-14 MED ORDER — MAGNESIUM SULFATE BOLUS VIA INFUSION
4.0000 g | Freq: Once | INTRAVENOUS | Status: AC
  Administered 2017-03-14: 4 g via INTRAVENOUS
  Filled 2017-03-14: qty 500

## 2017-03-14 MED ORDER — WITCH HAZEL-GLYCERIN EX PADS: 1.0000 "application " | MEDICATED_PAD | CUTANEOUS | Status: DC | PRN

## 2017-03-14 MED ORDER — SIMETHICONE 80 MG PO CHEW: 80.0000 mg | CHEWABLE_TABLET | ORAL | Status: DC | PRN

## 2017-03-14 MED ORDER — ONDANSETRON HCL 4 MG PO TABS: 4.0000 mg | ORAL_TABLET | ORAL | Status: DC | PRN

## 2017-03-14 MED ORDER — TETANUS-DIPHTH-ACELL PERTUSSIS 5-2.5-18.5 LF-MCG/0.5 IM SUSP: 0.5000 mL | Freq: Once | INTRAMUSCULAR | Status: DC

## 2017-03-14 NOTE — Progress Notes (Signed)
BP (!) 152/98   Pulse (!) 57   Temp 98.1 F (36.7 C) (Oral)   Resp 18   Ht 5' 4.5" (1.638 m)   Wt 170 lb 3.2 oz (77.2 kg)   BMI 28.76 kg/m    Started Labetalol protocol for BP  AROM>>clear af, cx 1-2/50/vtx  Will start MgSO4

## 2017-03-14 NOTE — Anesthesia Pain Management Evaluation Note (Signed)
  CRNA Pain Management Visit Note  Patient: Kathryn Barnes, 32 y.o., female  "Hello I am a member of the anesthesia team at [facility]. We have an anesthesia team available at all times to provide care throughout the hospital, including epidural management and anesthesia for C-section. I don't know your plan for the delivery whether it a natural birth, water birth, IV sedation, nitrous supplementation, doula or epidural, but we want to meet your pain goals."   1.Was your pain managed to your expectations on prior hospitalizations?   No prior hospitalizations  2.What is your expectation for pain management during this hospitalization?     Labor support without medications  3.How can we help you reach that goal? Be available if needed  Record the patient's initial score and the patient's pain goal.   Pain: 7  Pain Goal: 8 [facility] wants you to be able to say your pain was always managed very well.  Kathryn Barnes 03/14/2017

## 2017-03-15 LAB — CBC
HCT: 31.2 % — ABNORMAL LOW (ref 36.0–46.0)
Hemoglobin: 10.6 g/dL — ABNORMAL LOW (ref 12.0–15.0)
MCH: 27.8 pg (ref 26.0–34.0)
MCHC: 34 g/dL (ref 30.0–36.0)
MCV: 81.9 fL (ref 78.0–100.0)
PLATELETS: 245 10*3/uL (ref 150–400)
RBC: 3.81 MIL/uL — AB (ref 3.87–5.11)
RDW: 13.9 % (ref 11.5–15.5)
WBC: 13.6 10*3/uL — AB (ref 4.0–10.5)

## 2017-03-15 NOTE — Progress Notes (Signed)
CSW received consult for hx of Anxiety and Depression.  CSW met with MOB to offer support and complete assessment.   MOB was very pleasant and welcoming of CSW's visit.  She was friendly and easy to engage.  She reports feeling well, especially now that she is off magnesium.  She reports that baby is also doing well, and that it has really not set in yet that she is here and she is hers.  CSW validated this feeling and asked her how she feels she is bonding.  She reports positive bonding, but that she feels it will become less surreal once they get home.  CSW asked about her supports and she notes that her mother, sister and FOB were with her at delivery and are her main support people.  CSW inquired about her relationship with FOB and she paused.  She states, "I kicked him out when I was pregnant.  I couldn't stand him.  Even his breathing got on my nerves."  She followed this with, "he's my fiance.  I think it was just the pregnancy because, thankfully, we are fine now."  CSW and MOB talked in depth about this and in their communication styles and how she feels they will work together in successfully parenting their child.  She reports experiencing anxiety and depression to some degree in the past, but states she is someone who is able to compartmentalize her feelings in order to get through whatever the situation is and deal with her feelings at the appropriate time.  She acknowledges the importance of dealing with emotions so they don't "explode."  She states she does not like to be pressured, and prefers to deal with her feelings in her own time.  She seems to have a very good sense of self-awareness.  She states she has had counseling in the past, but that she does not feel it was beneficial because the therapist diagnoses her with Bipolar, which she did not agree with.  CSW discussed this diagnosis and agrees that it does not appear she has ever experienced a manic episode.  CSW explained the benefits of  counseling when the right counselor is found.  CSW commented that just like any profession, there are good and bad and that one needs to find the right fit in order to develop the therapeutic relationship.  CSW provided her with resources to seek counseling if she ever decides she would like to try it again.  MOB was appreciative of the information and states she would "rather have it and not need it than need it and not have it."  MOB states no emotional concerns at this time. CSW provided education regarding the baby blues period vs. perinatal mood disorders and recommends self-evaluation during the postpartum time period using the New Mom Checklist from Postpartum Progress and the Edinburgh Postnatal Depression Scale.  CSW encouraged MOB to contact a medical professional if symptoms are noted at any time.  MOB stated understanding and agreed. CSW provided review of Sudden Infant Death Syndrome (SIDS) precautions.   CSW identifies no further need for intervention and no barriers to discharge at this time. 

## 2017-03-15 NOTE — Lactation Note (Signed)
This note was copied from a baby's chart. Lactation Consultation Note  Patient Name: Kathryn Barnes OIZTI'W Date: 03/15/2017 Reason for consult: Initial assessment;Primapara;1st time breastfeeding;Early term 37-38.6wks;Infant < 6lbs  Visited with P71 Mom of [redacted]w[redacted]d baby at 68 hrs old.   Baby <6 lbs and at 5% weight loss.  Baby has been kept STS and offered breast often.(one latch score of 6 noted, no swallowing)  Baby has been spoon fed drops of colostrum per Mom a few times.  Baby has had 2 voids, no stool first 24 hrs.  Assisted with latching baby on right breast using football hold.  Mom assisted to hand express to stimulate flow.  Colostrum easily expressed.   Baby unable to attain a deep latch with sustained sucking and swallowing.  Baby continually slipped to nipple and fell asleep.   Initiated a 20 mm nipple with instructions on how to apply, and cleaning of NS.  Nipple pulled well into shield.  Baby latched with guidance, and sucked on and off sleepily for 20 mins.  Reminded Mom not to exceed 30 mins on the breast due to ET infant.    Set up DEBP and assisted Mom to begin pumping on initiation setting.  Reviewed cleaning and milk storage guidelines.    Talked about need to supplement baby with EBM+/Neosure > 8-10 ml on second day of life.  Mom aware that volume needs to increase each day.    Relayed recommendations to [facility] RN.  Mom given Foley Cup, and 5 fr and syringe to finger feed baby to use at next feeding at the breast possibly.    Mom encouraged to double pump post breastfeeding with goal of >8 pumpings per 24 hrs.  Mom has a Medela PIS pump at home from insurance.   Mom shown how to perform breast massage, and hand expression to add to double pumping.    LPTI guidelines shared.  Lactation brochure left with Mom.  Mom aware of [place] support post discharge, and encouraged her to make appointment with OP LC.     Maternal Data Formula Feeding for Exclusion: No Has patient  been taught Hand Expression?: Yes Does the patient have breastfeeding experience prior to this delivery?: No  Feeding Feeding Type: Breast Milk Length of feed: 20 min(on and off)  LATCH Score Latch: Repeated attempts needed to sustain latch, nipple held in mouth throughout feeding, stimulation needed to elicit sucking reflex.  Audible Swallowing: None  Type of Nipple: Everted at rest and after stimulation  Comfort (Breast/Nipple): Filling, red/small blisters or bruises, mild/mod discomfort  Hold (Positioning): Assistance needed to correctly position infant at breast and maintain latch.  LATCH Score: 5  Interventions Interventions: Breast feeding basics reviewed;Assisted with latch;Skin to skin;Breast massage;Hand express;Breast compression;Adjust position;Support pillows;Position options;Expressed milk;Coconut oil;DEBP;Hand pump  Lactation Tools Discussed/Used Tools: Pump Nipple shield size: 20 Breast pump type: Double-Electric Breast Pump WIC Program: No Pump Review: Setup, frequency, and cleaning;Milk Storage Initiated by:: Cipriano Mile RN IBCLC Date initiated:: 03/15/17   Consult Status Consult Status: Follow-up Date: 03/16/17 Follow-up type: In-patient    Broadus John 03/15/2017, 1:07 PM

## 2017-03-15 NOTE — Progress Notes (Signed)
Post Partum Day 1 Subjective: no complaints  Objective: Blood pressure 128/87, pulse 75, temperature 98.7 F (37.1 C), temperature source Oral, resp. rate 18, height 5' 4.5" (1.638 m), weight 165 lb (74.8 kg), SpO2 98 %, unknown if currently breastfeeding.  Physical Exam:  General: alert and cooperative Lochia: appropriate Uterine Fundus: firm Incision: n/a DVT Evaluation: No evidence of DVT seen on physical exam.  Recent Labs    03/13/17 2311 03/15/17 0531  HGB 11.0* 10.6*  HCT 32.3* 31.2*    Assessment/Plan: PPD#1 - doing well, pre-eclampsia Will d/c magnesium  Monitor BP closely   LOS: 2 days   Kathryn Barnes 03/15/2017, 8:59 AM

## 2017-03-16 MED ORDER — IBUPROFEN 800 MG PO TABS: 800.0000 mg | ORAL_TABLET | Freq: Three times a day (TID) | ORAL | 0 refills | Status: DC | PRN

## 2017-03-16 MED ORDER — LABETALOL HCL 200 MG PO TABS: 200.0000 mg | ORAL_TABLET | Freq: Three times a day (TID) | ORAL | 0 refills | Status: DC

## 2017-03-16 MED ORDER — LABETALOL HCL 200 MG PO TABS
200.0000 mg | ORAL_TABLET | Freq: Three times a day (TID) | ORAL | Status: DC
  Administered 2017-03-16 (×2): 200 mg via ORAL
  Filled 2017-03-16 (×2): qty 1

## 2017-03-16 NOTE — Progress Notes (Signed)
Post Partum Day 2 Subjective: no complaints, up ad lib, voiding and tolerating PO.  No HA, CP/SOB, RUQ pain or visual disturbance.  Objective: Blood pressure (!) 144/95, pulse (!) 57, temperature 98.8 F (37.1 C), temperature source Oral, resp. rate 18, height 5' 4.5" (1.638 m), weight 165 lb (74.8 kg), SpO2 99 %, unknown if currently breastfeeding.  Physical Exam:  General: alert, cooperative and appears stated age Lochia: appropriate Uterine Fundus: firm Incision: healing well, no significant drainage, no dehiscence DVT Evaluation: No evidence of DVT seen on physical exam. Negative Homan's sign. No cords or calf tenderness.  Recent Labs    03/13/17 2311 03/15/17 0531  HGB 11.0* 10.6*  HCT 32.3* 31.2*    Assessment/Plan: Pre-eclampsia-s/p magnesium recovery.  Labetalol 200 TID started. Discharge home this pm if BP well controlled.   LOS: 3 days   [place] Hanlon 03/16/2017, 10:55 AM

## 2017-03-16 NOTE — Discharge Summary (Signed)
Obstetric Discharge Summary Reason for Admission: induction of labor Prenatal Procedures: none Intrapartum Procedures: spontaneous vaginal delivery Postpartum Procedures: none Complications-Operative and Postpartum: right periurethral laceration Hemoglobin  Date Value Ref Range Status  03/15/2017 10.6 (L) 12.0 - 15.0 g/dL Final   HCT  Date Value Ref Range Status  03/15/2017 31.2 (L) 36.0 - 46.0 % Final    Physical Exam:  General: alert, cooperative and appears stated age 11: appropriate Uterine Fundus: firm Incision: healing well, no significant drainage, no dehiscence DVT Evaluation: No evidence of DVT seen on physical exam. Negative Homan's sign. No cords or calf tenderness.  Discharge Diagnoses: Term Pregnancy-delivered and Preelampsia  Discharge Information: Date: 03/16/2017 Activity: pelvic rest Diet: routine Medications: PNV, Ibuprofen and labetalol Condition: stable Instructions: refer to practice specific booklet Discharge to: home   Newborn Data: Live born female  Birth Weight: 5 lb 11.4 oz (2590 g) APGAR: 48, 9  Newborn Delivery   Birth date/time:  03/14/2017 10:44:00 Delivery type:  Vaginal, Spontaneous     Home with mother.  Kamiah Fite, [place] 03/16/2017, 3:10 PM

## 2017-03-16 NOTE — Lactation Note (Signed)
This note was copied from a baby's chart. Lactation Consultation Note  Patient Name: Kathryn Barnes TKPTW'S Date: 03/16/2017 Reason for consult: Follow-up assessment;Infant weight loss;Early term 37-38.6wks;Primapara;Infant < 6lbs(8% weight loss )  Baby is 54 hours old,  LC reviewed and updated the doc flow sheets  LC recommended to mom since the weight loss is 8%  To switch and feed the baby 15 -20 mins 1st breast  And supplement afterwards with a artifical nipple ( for quick calories the baby doesn't have to work hard for)  LC explained to mom the importance of protecting the baby's energy level.  STS feedings until the baby can stay awake for a feeding and back up to birth weight.  Until her milk comes in well, to supplement back with her EBM and then formula , not to mix the two.  Per mom I didn't plan on giving the baby a bottle.  LC recommended using the cup until the 1st re- weight check tomorrow and if the weight is not increased to  Switch to the artifical nipple. Also today work on increasing the volume the baby is receiving each feeding.  Up to 30 ml.  Per mom has a DEBP at home ( [employer] ) and LC recommended post  pumping both breast for 10 -15 mins  , save milk for next feeding,  LC reviewed Early term potential feeding patterns and the importance not going over 3 hours without feeding baby.  Is the baby is sluggish - feed the baby supplement 1st and then attempt to latch.  And post pump.  Sore nipple and engorgement prevention and tx reviewed.  LC offered mom and LC O/P appt and mom requested to be able to call back when a ride could be arranged.  Mother informed of post-discharge support and given phone number to the lactation department, including services for phone call assistance; out-patient appointments; and breastfeeding support group. List of other breastfeeding resources in the community given in the handout. Encouraged mother to call for problems or concerns  related to breastfeeding.    Maternal Data Has patient been taught Hand Expression?: Yes  Feeding Feeding Type: Breast Fed Length of feed: 5 min(per mom swallows )  Baby was supplemented after the feeding with EBM and  Formula.   LATCH Score                   Interventions Interventions: Breast feeding basics reviewed  Lactation Tools Discussed/Used Pump Review: Milk Storage   Consult Status Consult Status: Follow-up Date: (mom to call for [facility] O/P when she knows her schediule ) Follow-up type: [place] 03/16/2017, 2:36 PM

## 2017-03-16 NOTE — Progress Notes (Signed)
D/c instructions reviewed, signed, & given.  Prescriptions discussed to include frequency, side effects, & indication for labetalol.  Pt aware of s/s of PreE & states she knows to make an appt for Mon/Tues next week for BP check.  IV d/c'd & pt in stable condition to d/c at this time with sig other via w/c.

## 2017-03-16 NOTE — Discharge Instructions (Signed)
Call MD for T>100.4, heavy vaginal bleeding, severe abdominal pain, intractable nausea and/or vomiting, or respiratory distress.  Call office to schedule blood pressure check in 1 weeks.  Pelvic rest x 6 weeks.

## 2017-06-15 ENCOUNTER — Other Ambulatory Visit (HOSPITAL_COMMUNITY): Payer: Self-pay | Admitting: Otolaryngology

## 2017-06-15 DIAGNOSIS — D110 Benign neoplasm of parotid gland: Secondary | ICD-10-CM

## 2017-06-22 ENCOUNTER — Encounter (HOSPITAL_BASED_OUTPATIENT_CLINIC_OR_DEPARTMENT_OTHER): Payer: Self-pay

## 2017-06-22 ENCOUNTER — Ambulatory Visit (HOSPITAL_BASED_OUTPATIENT_CLINIC_OR_DEPARTMENT_OTHER)
Admission: RE | Admit: 2017-06-22 | Discharge: 2017-06-22 | Disposition: A | Discharge status: Home / Self Care | Payer: Commercial Managed Care - PPO | Source: Ambulatory Visit | Attending: Otolaryngology | Admitting: Otolaryngology

## 2017-06-25 ENCOUNTER — Ambulatory Visit (HOSPITAL_BASED_OUTPATIENT_CLINIC_OR_DEPARTMENT_OTHER)
Admission: RE | Admit: 2017-06-25 | Discharge: 2017-06-25 | Disposition: A | Discharge status: Home / Self Care | Payer: Commercial Managed Care - PPO | Source: Ambulatory Visit | Attending: Otolaryngology | Admitting: Otolaryngology

## 2017-06-25 DIAGNOSIS — D110 Benign neoplasm of parotid gland: Secondary | ICD-10-CM | POA: Diagnosis not present

## 2017-06-25 MED ORDER — IOPAMIDOL (ISOVUE-300) INJECTION 61%
100.0000 mL | Freq: Once | INTRAVENOUS | Status: AC | PRN
  Administered 2017-06-25: 75 mL via INTRAVENOUS

## 2017-07-03 ENCOUNTER — Encounter (HOSPITAL_BASED_OUTPATIENT_CLINIC_OR_DEPARTMENT_OTHER): Payer: Self-pay | Admitting: *Deleted

## 2017-07-03 ENCOUNTER — Other Ambulatory Visit: Payer: Self-pay

## 2017-07-03 ENCOUNTER — Ambulatory Visit: Payer: Self-pay | Admitting: Otolaryngology

## 2017-07-03 NOTE — H&P (Signed)
PREOPERATIVE H&P  Chief Complaint: Left parotid mass  HPI: Kathryn Barnes is a 32 y.o. female who presents for evaluation of a slowly enlarging left parotid mass that she has had for about a year.  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  She has had no pain associated with the mass.  She has normal facial nerve function.  She is otherwise healthy.  Past Medical History:  Diagnosis Date  . Anxiety    no recent panic attacks  . Medical history non-contributory    Past Surgical History:  Procedure Laterality Date  . NO PAST SURGERIES     Social History   Socioeconomic History  . Marital status: Single    Spouse name: Not on file  . Number of children: Not on file  . Years of education: Not on file  . Highest education level: Not on file  Occupational History  . Not on file  Social Needs  . Financial resource strain: Not on file  . Food insecurity:    Worry: Not on file    Inability: Not on file  . Transportation needs:    Medical: Not on file    Non-medical: Not on file  Tobacco Use  . Smoking status: Never Smoker  . Smokeless tobacco: Never Used  Substance and Sexual Activity  . Alcohol use: No    Frequency: Never  . Drug use: No  . Sexual activity: Yes  Lifestyle  . Physical activity:    Days per week: Not on file    Minutes per session: Not on file  . Stress: Not on file  Relationships  . Social connections:    Talks on phone: Not on file    Gets together: Not on file    Attends religious service: Not on file    Active member of club or organization: Not on file    Attends meetings of clubs or organizations: Not on file    Relationship status: Not on file  Other Topics Concern  . Not on file  Social History Narrative  . Not on file   Family History  Problem Relation Age of Onset  . Diabetes Maternal Grandmother   . Hypertension Maternal Grandmother   . Stroke  Maternal Grandmother   . Anemia Mother   . Anxiety disorder Maternal Aunt   . Alcohol abuse Maternal Grandfather    Allergies  Allergen Reactions  . Robaxin [Methocarbamol] Hives   Prior to Admission medications   Medication Sig Start Date End Date Taking? Authorizing Provider  aspirin-sod bicarb-citric acid (ALKA-SELTZER) 325 MG TBEF tablet Take 325 mg by mouth every 6 (six) hours as needed (upset stomach).    [provider]  Cal Carb-Mag Hydrox-Simeth (ROLAIDS ADVANCED) 1000-200-40 MG CHEW Chew 1 tablet by mouth every 6 (six) hours as needed (heart burn).    [provider]  docusate sodium (COLACE) 100 MG capsule Take 100 mg by mouth daily.    [provider]  ferrous sulfate 325 (65 FE) MG tablet Take 325 mg by mouth daily with breakfast.    [provider]  ibuprofen (ADVIL,MOTRIN) 800 MG tablet Take 1 tablet (800 mg total) by mouth every 8 (eight) hours as needed for moderate pain. 03/16/17   Morris, Jinny Blossom, DO  labetalol (NORMODYNE) 200 MG tablet Take 1 tablet (200 mg total) by mouth 3 (three) times daily. 03/16/17   Linda Hedges,  DO  Prenatal Vit-Fe Fumarate-FA (MULTIVITAMIN-PRENATAL) 27-0.8 MG TABS tablet Take 1 tablet by mouth daily at 12 noon.    [provider]     Positive ROS: Negative  All other systems have been reviewed and were otherwise negative with the exception of those mentioned in the HPI and as above.  Physical Exam: There were no vitals filed for this visit.  General: Alert, no acute distress Oral: Normal oral mucosa and tonsils Nasal: Clear nasal passages Neck: No palpable adenopathy.  She has an approximate 2 cm left parotid mass just in front of the left earlobe.  No adenopathy lower in the neck Ear: Ear canal is clear with normal appearing TMs Cardiovascular: Regular rate and rhythm, no murmur.  Respiratory: Clear to auscultation Neurologic: Alert and oriented x 3   Assessment/Plan: neoplasam parotid tumor  left Plan for Procedure(s): LEFT PAROTIDECTOMY   [name], MD 07/03/2017 9:59 AM

## 2017-07-04 NOTE — Anesthesia Preprocedure Evaluation (Signed)
Anesthesia Evaluation  Patient identified by MRN, date of birth, ID band Patient awake    Reviewed: Allergy & Precautions, H&P , NPO status , Patient's Chart, lab work & pertinent test results, reviewed documented beta blocker date and time   Airway Mallampati: II  TM Distance: >3 FB Neck ROM: full    Dental no notable dental hx.    Pulmonary    Pulmonary exam normal breath sounds clear to auscultation       Cardiovascular Exercise Tolerance: Good hypertension, Pt. on medications  Rhythm:regular Rate:Normal     Neuro/Psych Anxiety    GI/Hepatic   Endo/Other    Renal/GU      Musculoskeletal   Abdominal   Peds  Hematology   Anesthesia Other Findings   Reproductive/Obstetrics                             Anesthesia Physical Anesthesia Plan  ASA: II  Anesthesia Plan: General   Post-op Pain Management:    Induction: Intravenous  PONV Risk Score and Plan: 3 and Ondansetron, Dexamethasone, Treatment may vary due to age or medical condition and Midazolam  Airway Management Planned: Oral ETT and LMA  Additional Equipment:   Intra-op Plan:   Post-operative Plan: Extubation in OR  Informed Consent: I have reviewed the patients History and Physical, chart, labs and discussed the procedure including the risks, benefits and alternatives for the proposed anesthesia with the patient or authorized representative who has indicated his/her understanding and acceptance.   Dental Advisory Given  Plan Discussed with: CRNA, Anesthesiologist and Surgeon  Anesthesia Plan Comments: (  )        Anesthesia Quick Evaluation

## 2017-07-05 ENCOUNTER — Encounter (HOSPITAL_BASED_OUTPATIENT_CLINIC_OR_DEPARTMENT_OTHER): Payer: Self-pay | Admitting: *Deleted

## 2017-07-05 ENCOUNTER — Ambulatory Visit (HOSPITAL_BASED_OUTPATIENT_CLINIC_OR_DEPARTMENT_OTHER): Payer: Commercial Managed Care - PPO | Admitting: Anesthesiology

## 2017-07-05 ENCOUNTER — Other Ambulatory Visit: Payer: Self-pay

## 2017-07-05 ENCOUNTER — Encounter (HOSPITAL_BASED_OUTPATIENT_CLINIC_OR_DEPARTMENT_OTHER)
Admission: RE | Disposition: A | Discharge status: Home / Self Care | Payer: Self-pay | Source: Ambulatory Visit | Attending: Otolaryngology

## 2017-07-05 ENCOUNTER — Ambulatory Visit (HOSPITAL_BASED_OUTPATIENT_CLINIC_OR_DEPARTMENT_OTHER)
Admission: RE | Admit: 2017-07-05 | Discharge: 2017-07-06 | Disposition: A | Discharge status: Home / Self Care | Payer: Commercial Managed Care - PPO | Source: Ambulatory Visit | Attending: Otolaryngology | Admitting: Otolaryngology

## 2017-07-05 DIAGNOSIS — F419 Anxiety disorder, unspecified: Secondary | ICD-10-CM | POA: Diagnosis not present

## 2017-07-05 DIAGNOSIS — D490 Neoplasm of unspecified behavior of digestive system: Secondary | ICD-10-CM | POA: Diagnosis present

## 2017-07-05 DIAGNOSIS — C07 Malignant neoplasm of parotid gland: Secondary | ICD-10-CM | POA: Insufficient documentation

## 2017-07-05 DIAGNOSIS — Z79899 Other long term (current) drug therapy: Secondary | ICD-10-CM | POA: Insufficient documentation

## 2017-07-05 HISTORY — PX: PAROTIDECTOMY: SHX2163

## 2017-07-05 HISTORY — DX: Essential (primary) hypertension: I10

## 2017-07-05 SURGERY — EXCISION, PAROTID GLAND
Anesthesia: General | Laterality: Left

## 2017-07-05 MED ORDER — BUPIVACAINE HCL (PF) 0.25 % IJ SOLN
INTRAMUSCULAR | Status: AC
  Filled 2017-07-05: qty 30

## 2017-07-05 MED ORDER — EPHEDRINE SULFATE 50 MG/ML IJ SOLN
INTRAMUSCULAR | Status: DC | PRN
  Administered 2017-07-05: 10 mg via INTRAVENOUS

## 2017-07-05 MED ORDER — LIDOCAINE-EPINEPHRINE 1 %-1:100000 IJ SOLN
INTRAMUSCULAR | Status: DC | PRN
  Administered 2017-07-05: 5 mL

## 2017-07-05 MED ORDER — ONDANSETRON HCL 4 MG/2ML IJ SOLN: 4.0000 mg | INTRAMUSCULAR | Status: DC | PRN

## 2017-07-05 MED ORDER — PROPOFOL 500 MG/50ML IV EMUL
INTRAVENOUS | Status: AC
  Filled 2017-07-05: qty 50

## 2017-07-05 MED ORDER — PHENYLEPHRINE 40 MCG/ML (10ML) SYRINGE FOR IV PUSH (FOR BLOOD PRESSURE SUPPORT)
PREFILLED_SYRINGE | INTRAVENOUS | Status: AC
  Filled 2017-07-05: qty 10

## 2017-07-05 MED ORDER — FENTANYL CITRATE (PF) 100 MCG/2ML IJ SOLN
INTRAMUSCULAR | Status: AC
  Filled 2017-07-05: qty 2

## 2017-07-05 MED ORDER — SUCCINYLCHOLINE CHLORIDE 20 MG/ML IJ SOLN
INTRAMUSCULAR | Status: DC | PRN
  Administered 2017-07-05: 100 mg via INTRAVENOUS

## 2017-07-05 MED ORDER — EPINEPHRINE 30 MG/30ML IJ SOLN
INTRAMUSCULAR | Status: AC
  Filled 2017-07-05: qty 1

## 2017-07-05 MED ORDER — MUPIROCIN 2 % EX OINT
TOPICAL_OINTMENT | CUTANEOUS | Status: AC
  Filled 2017-07-05: qty 22

## 2017-07-05 MED ORDER — ONDANSETRON HCL 4 MG/2ML IJ SOLN
INTRAMUSCULAR | Status: AC
  Filled 2017-07-05: qty 2

## 2017-07-05 MED ORDER — MORPHINE SULFATE (PF) 2 MG/ML IV SOLN: 2.0000 mg | INTRAVENOUS | Status: DC | PRN

## 2017-07-05 MED ORDER — EPHEDRINE SULFATE 50 MG/ML IJ SOLN
INTRAMUSCULAR | Status: AC
  Filled 2017-07-05: qty 1

## 2017-07-05 MED ORDER — BACITRACIN ZINC 500 UNIT/GM EX OINT: 1.0000 "application " | TOPICAL_OINTMENT | Freq: Three times a day (TID) | CUTANEOUS | Status: DC

## 2017-07-05 MED ORDER — MEPERIDINE HCL 25 MG/ML IJ SOLN: 6.2500 mg | INTRAMUSCULAR | Status: DC | PRN

## 2017-07-05 MED ORDER — MIDAZOLAM HCL 2 MG/2ML IJ SOLN
INTRAMUSCULAR | Status: AC
  Filled 2017-07-05: qty 2

## 2017-07-05 MED ORDER — FENTANYL CITRATE (PF) 100 MCG/2ML IJ SOLN: 50.0000 ug | INTRAMUSCULAR | Status: DC | PRN

## 2017-07-05 MED ORDER — FENTANYL CITRATE (PF) 100 MCG/2ML IJ SOLN
25.0000 ug | INTRAMUSCULAR | Status: DC | PRN
  Administered 2017-07-05: 25 ug via INTRAVENOUS

## 2017-07-05 MED ORDER — SUFENTANIL CITRATE 50 MCG/ML IV SOLN
INTRAVENOUS | Status: DC | PRN
  Administered 2017-07-05: 5 ug via INTRAVENOUS
  Administered 2017-07-05: 10 ug via INTRAVENOUS
  Administered 2017-07-05: 5 ug via INTRAVENOUS

## 2017-07-05 MED ORDER — CHLORHEXIDINE GLUCONATE CLOTH 2 % EX PADS: 6.0000 | MEDICATED_PAD | Freq: Once | CUTANEOUS | Status: DC

## 2017-07-05 MED ORDER — SUCCINYLCHOLINE CHLORIDE 200 MG/10ML IV SOSY
PREFILLED_SYRINGE | INTRAVENOUS | Status: AC
  Filled 2017-07-05: qty 10

## 2017-07-05 MED ORDER — LIDOCAINE-EPINEPHRINE 1 %-1:100000 IJ SOLN
INTRAMUSCULAR | Status: AC
  Filled 2017-07-05: qty 1

## 2017-07-05 MED ORDER — ONDANSETRON HCL 4 MG/2ML IJ SOLN
INTRAMUSCULAR | Status: DC | PRN
  Administered 2017-07-05: 4 mg via INTRAVENOUS

## 2017-07-05 MED ORDER — ONDANSETRON HCL 4 MG PO TABS: 4.0000 mg | ORAL_TABLET | ORAL | Status: DC | PRN

## 2017-07-05 MED ORDER — ACETAMINOPHEN 325 MG PO TABS: 325.0000 mg | ORAL_TABLET | ORAL | Status: DC | PRN

## 2017-07-05 MED ORDER — SUFENTANIL CITRATE 50 MCG/ML IV SOLN
INTRAVENOUS | Status: AC
  Filled 2017-07-05: qty 1

## 2017-07-05 MED ORDER — LIDOCAINE HCL (CARDIAC) PF 100 MG/5ML IV SOSY
PREFILLED_SYRINGE | INTRAVENOUS | Status: AC
  Filled 2017-07-05: qty 5

## 2017-07-05 MED ORDER — DEXAMETHASONE SODIUM PHOSPHATE 10 MG/ML IJ SOLN
INTRAMUSCULAR | Status: AC
  Filled 2017-07-05: qty 1

## 2017-07-05 MED ORDER — SCOPOLAMINE 1 MG/3DAYS TD PT72: 1.0000 | MEDICATED_PATCH | Freq: Once | TRANSDERMAL | Status: DC | PRN

## 2017-07-05 MED ORDER — CEFAZOLIN SODIUM-DEXTROSE 2-4 GM/100ML-% IV SOLN
2.0000 g | INTRAVENOUS | Status: AC
  Administered 2017-07-05: 2 g via INTRAVENOUS

## 2017-07-05 MED ORDER — ACETAMINOPHEN 160 MG/5ML PO SOLN: 325.0000 mg | ORAL | Status: DC | PRN

## 2017-07-05 MED ORDER — OXYCODONE HCL 5 MG/5ML PO SOLN: 5.0000 mg | Freq: Once | ORAL | Status: DC | PRN

## 2017-07-05 MED ORDER — IBUPROFEN 100 MG/5ML PO SUSP: 400.0000 mg | Freq: Four times a day (QID) | ORAL | Status: DC | PRN

## 2017-07-05 MED ORDER — DEXAMETHASONE SODIUM PHOSPHATE 4 MG/ML IJ SOLN
INTRAMUSCULAR | Status: DC | PRN
  Administered 2017-07-05: 10 mg via INTRAVENOUS

## 2017-07-05 MED ORDER — PHENYLEPHRINE HCL 10 MG/ML IJ SOLN
INTRAMUSCULAR | Status: DC | PRN
  Administered 2017-07-05: 40 ug via INTRAVENOUS

## 2017-07-05 MED ORDER — MIDAZOLAM HCL 2 MG/2ML IJ SOLN
1.0000 mg | INTRAMUSCULAR | Status: DC | PRN
  Administered 2017-07-05: 2 mg via INTRAVENOUS

## 2017-07-05 MED ORDER — CEFAZOLIN SODIUM-DEXTROSE 1-4 GM/50ML-% IV SOLN
1.0000 g | Freq: Three times a day (TID) | INTRAVENOUS | Status: DC
  Administered 2017-07-05 – 2017-07-06 (×3): 1 g via INTRAVENOUS
  Filled 2017-07-05 (×3): qty 50

## 2017-07-05 MED ORDER — HYDROCODONE-ACETAMINOPHEN 5-325 MG PO TABS
1.0000 | ORAL_TABLET | ORAL | Status: DC | PRN
  Administered 2017-07-05: 1 via ORAL
  Administered 2017-07-05 – 2017-07-06 (×4): 2 via ORAL
  Filled 2017-07-05 (×4): qty 2
  Filled 2017-07-05: qty 1

## 2017-07-05 MED ORDER — LACTATED RINGERS IV SOLN
INTRAVENOUS | Status: DC
  Administered 2017-07-05 (×2): via INTRAVENOUS

## 2017-07-05 MED ORDER — PHENYLEPHRINE HCL 10 MG/ML IJ SOLN
INTRAVENOUS | Status: DC | PRN
  Administered 2017-07-05: 40 ug/min via INTRAVENOUS

## 2017-07-05 MED ORDER — LIDOCAINE HCL (CARDIAC) PF 100 MG/5ML IV SOSY
PREFILLED_SYRINGE | INTRAVENOUS | Status: DC | PRN
  Administered 2017-07-05: 75 mg via INTRAVENOUS

## 2017-07-05 MED ORDER — CEFAZOLIN SODIUM-DEXTROSE 2-4 GM/100ML-% IV SOLN
INTRAVENOUS | Status: AC
  Filled 2017-07-05: qty 100

## 2017-07-05 MED ORDER — OXYCODONE HCL 5 MG PO TABS: 5.0000 mg | ORAL_TABLET | Freq: Once | ORAL | Status: DC | PRN

## 2017-07-05 MED ORDER — MUPIROCIN 2 % EX OINT
TOPICAL_OINTMENT | CUTANEOUS | Status: DC | PRN
  Administered 2017-07-05: 1 via TOPICAL

## 2017-07-05 MED ORDER — KCL IN DEXTROSE-NACL 20-5-0.45 MEQ/L-%-% IV SOLN
INTRAVENOUS | Status: DC
  Administered 2017-07-05: 12:00:00 via INTRAVENOUS
  Filled 2017-07-05: qty 1000

## 2017-07-05 MED ORDER — ONDANSETRON HCL 4 MG/2ML IJ SOLN: 4.0000 mg | Freq: Once | INTRAMUSCULAR | Status: DC | PRN

## 2017-07-05 SURGICAL SUPPLY — 79 items
APPLICATOR COTTON TIP 6 STRL (MISCELLANEOUS) ×1 IMPLANT
APPLICATOR COTTON TIP 6IN STRL (MISCELLANEOUS) ×3
ATTRACTOMAT 16X20 MAGNETIC DRP (DRAPES) ×3 IMPLANT
BENZOIN TINCTURE PRP APPL 2/3 (GAUZE/BANDAGES/DRESSINGS) IMPLANT
BLADE SURG 12 STRL SS (BLADE) ×3 IMPLANT
BLADE SURG 15 STRL LF DISP TIS (BLADE) ×1 IMPLANT
BLADE SURG 15 STRL SS (BLADE) ×2
CANISTER SUCT 1200ML W/VALVE (MISCELLANEOUS) ×3 IMPLANT
CLEANER CAUTERY TIP 5X5 PAD (MISCELLANEOUS) IMPLANT
CLIP VESOCCLUDE SM WIDE 6/CT (CLIP) ×6 IMPLANT
CLOSURE WOUND 1/2 X4 (GAUZE/BANDAGES/DRESSINGS)
CORD BIPOLAR FORCEPS 12FT (ELECTRODE) ×3 IMPLANT
COTTONBALL LRG STERILE PKG (GAUZE/BANDAGES/DRESSINGS) ×3 IMPLANT
COVER BACK TABLE 60X90IN (DRAPES) ×3 IMPLANT
COVER MAYO STAND STRL (DRAPES) ×3 IMPLANT
DECANTER SPIKE VIAL GLASS SM (MISCELLANEOUS) ×3 IMPLANT
DERMABOND ADVANCED (GAUZE/BANDAGES/DRESSINGS)
DERMABOND ADVANCED .7 DNX12 (GAUZE/BANDAGES/DRESSINGS) IMPLANT
DRAIN JP 10F RND SILICONE (MISCELLANEOUS) IMPLANT
DRAIN PENROSE 1/4X12 LTX STRL (WOUND CARE) IMPLANT
DRAPE SURG 17X23 STRL (DRAPES) ×3 IMPLANT
DRAPE U-SHAPE 76X120 STRL (DRAPES) ×3 IMPLANT
ELECT COATED BLADE 2.86 ST (ELECTRODE) ×3 IMPLANT
ELECT REM PT RETURN 9FT ADLT (ELECTROSURGICAL) ×3
ELECTRODE REM PT RTRN 9FT ADLT (ELECTROSURGICAL) ×1 IMPLANT
EVACUATOR SILICONE 100CC (DRAIN) IMPLANT
FORCEPS BIPOLAR SPETZLER 8 1.0 (NEUROSURGERY SUPPLIES) ×3 IMPLANT
GAUZE SPONGE 4X4 12PLY STRL LF (GAUZE/BANDAGES/DRESSINGS) IMPLANT
GAUZE SPONGE 4X4 16PLY XRAY LF (GAUZE/BANDAGES/DRESSINGS) ×3 IMPLANT
GLOVE BIOGEL M 6.5 STRL (GLOVE) ×3 IMPLANT
GLOVE BIOGEL PI IND STRL 6.5 (GLOVE) ×2 IMPLANT
GLOVE BIOGEL PI IND STRL 7.0 (GLOVE) ×3 IMPLANT
GLOVE BIOGEL PI INDICATOR 6.5 (GLOVE) ×4
GLOVE BIOGEL PI INDICATOR 7.0 (GLOVE) ×6
GLOVE ECLIPSE 6.5 STRL STRAW (GLOVE) ×6 IMPLANT
GLOVE SS BIOGEL STRL SZ 7.5 (GLOVE) ×1 IMPLANT
GLOVE SUPERSENSE BIOGEL SZ 7.5 (GLOVE) ×2
GLOVE SURG SS PI 6.0 STRL IVOR (GLOVE) ×3 IMPLANT
GLOVE SURG SS PI 7.5 STRL IVOR (GLOVE) ×3 IMPLANT
GOWN STRL REUS W/ TWL LRG LVL3 (GOWN DISPOSABLE) ×4 IMPLANT
GOWN STRL REUS W/ TWL XL LVL3 (GOWN DISPOSABLE) ×2 IMPLANT
GOWN STRL REUS W/TWL LRG LVL3 (GOWN DISPOSABLE) ×8
GOWN STRL REUS W/TWL XL LVL3 (GOWN DISPOSABLE) ×4
LOCATOR NERVE 3 VOLT (DISPOSABLE) ×3 IMPLANT
NEEDLE HYPO 25X1 1.5 SAFETY (NEEDLE) ×3 IMPLANT
NS IRRIG 1000ML POUR BTL (IV SOLUTION) ×3 IMPLANT
PACK BASIN DAY SURGERY FS (CUSTOM PROCEDURE TRAY) ×3 IMPLANT
PAD CLEANER CAUTERY TIP 5X5 (MISCELLANEOUS)
PENCIL BUTTON HOLSTER BLD 10FT (ELECTRODE) ×3 IMPLANT
PIN SAFETY STERILE (MISCELLANEOUS) IMPLANT
SHEET MEDIUM DRAPE 40X70 STRL (DRAPES) IMPLANT
SLEEVE SCD COMPRESS KNEE MED (MISCELLANEOUS) ×3 IMPLANT
SPONGE INTESTINAL PEANUT (DISPOSABLE) ×3 IMPLANT
STAPLER VISISTAT 35W (STAPLE) IMPLANT
STRIP CLOSURE SKIN 1/2X4 (GAUZE/BANDAGES/DRESSINGS) IMPLANT
SUCTION FRAZIER HANDLE 10FR (MISCELLANEOUS)
SUCTION TUBE FRAZIER 10FR DISP (MISCELLANEOUS) IMPLANT
SUT CHROMIC 3 0 PS 2 (SUTURE) ×3 IMPLANT
SUT ETHILON 3 0 PS 1 (SUTURE) ×3 IMPLANT
SUT ETHILON 4 0 PS 2 18 (SUTURE) IMPLANT
SUT ETHILON 5 0 P 3 18 (SUTURE) ×2
SUT ETHILON 6 0 P 1 (SUTURE) IMPLANT
SUT NYLON ETHILON 5-0 P-3 1X18 (SUTURE) ×1 IMPLANT
SUT SILK 2 0 PERMA HAND 18 BK (SUTURE) ×6 IMPLANT
SUT SILK 2 0 TIES 17X18 (SUTURE) ×2
SUT SILK 2-0 18XBRD TIE BLK (SUTURE) ×1 IMPLANT
SUT SILK 3 0 PS 1 (SUTURE) IMPLANT
SUT SILK 3 0 SH 30 (SUTURE) IMPLANT
SUT SILK 3 0 TIES 17X18 (SUTURE) ×2
SUT SILK 3-0 18XBRD TIE BLK (SUTURE) ×1 IMPLANT
SUT SILK 4 0 TIES 17X18 (SUTURE) IMPLANT
SWAB COLLECTION DEVICE MRSA (MISCELLANEOUS) IMPLANT
SWAB CULTURE ESWAB REG 1ML (MISCELLANEOUS) IMPLANT
SYR BULB 3OZ (MISCELLANEOUS) ×3 IMPLANT
SYR CONTROL 10ML LL (SYRINGE) ×3 IMPLANT
TOWEL GREEN STERILE FF (TOWEL DISPOSABLE) ×6 IMPLANT
TRAY DSU PREP LF (CUSTOM PROCEDURE TRAY) ×3 IMPLANT
TUBE CONNECTING 20'X1/4 (TUBING) ×1
TUBE CONNECTING 20X1/4 (TUBING) ×2 IMPLANT

## 2017-07-05 NOTE — Op Note (Signed)
NAMEAZIAH, KAISER MEDICAL RECORD IN:[id number] ACCOUNT [id number] DATE OF BIRTH:[birth year] FACILITY: [facility] LOCATION: [place], [place]  OPERATIVE REPORT  DATE OF PROCEDURE:  07/05/2017  PREOPERATIVE DIAGNOSIS:  Left parotid tumor.  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  OPERATION PERFORMED:  Left parotidectomy with facial nerve dissection.  SURGEON:  [name], MD.  ASSISTANT:  Minna Merritts, MD  ANESTHESIA:  General endotracheal.  ESTIMATED BLOOD LOSS:  Minimal.  COMPLICATIONS:  None.  BRIEF CLINICAL NOTE:  The patient is a 32 year old female.  The patient has had a left slowly enlarging parotid mass for the past year.  It has gradually enlarged in size.  She has had no significant facial nerve weakness.  No pain or numbness associated  with this.  She underwent a CT scan which showed a 2.5 x 3.8 cm left parotid mass that was favored to represent a parotid benign neoplasm or mixed tumor.  She is otherwise healthy.  She does not smoke and no history of diabetes.  She is taken to the  operating room at this time for a left parotidectomy with facial nerve dissection.  DESCRIPTION OF PROCEDURE:  After adequate endotracheal anesthesia, the proposed incision site was marked out and injected with Xylocaine with epinephrine.  The area was then prepped with Betadine solution and draped off with sterile towels so as to  expose the face for facial function monitoring during the case.  A standard parotid incision was made.  Dissection was carried down through anteriorly off the parotid fascia.  Dissection was carried out posteriorly along the tail of the parotid down to  the sternocleidomastoid muscle.  The mass was located just anterior to the cartilage of the ear canal in the mid portion of the parotid gland.  Dissection was carried down along the cartilage of the  ear canal.  There was no adherence of the mass to the  ear canal.  Dissection was then carried down off the mastoid tip and the styloid process was palpated and the trunk of the facial nerve was found in its normal anatomical position just inferior to the styloid process.  The trunk was then dissected out  anteriorly.  At the division of the facial nerve, the mass extended just beneath this and the mass was slightly adherent to the facial nerve at this point.  Initial dissection was carried along the inferior branches of the facial nerve inferiorly.   Trying to dissect off the superior branches of the facial nerve, the mass was actually adherent to the facial nerve in this region, making dissection a little bit difficult in this area.  Dissection was then carried out a little bit superior where the  mass extended.   Again the mass was very firm superiorly.  It was elected to try to find the distal branches of the facial nerve superiorly and dissection was carried out superiorly to find the superior branches of the facial nerve and the branches of  the facial nerve were discovered superiorly at the incision site adjacent to a vein superiorly that was ligated with a 3-0 silk suture and divided.  At this point, the dissection of the superior branches of the facial nerve were a little bit difficult  because of the adherence of the nerve to the tumor.  It was elected to remove the superficial portion of the tumor and send it for frozen section along with a small piece of tissue from the  mid portion of the tumor posteriorly superiorly to the facial  nerve.  One separate piece of tumor representing the posterior superior margin was also removed and sent as a separate specimen.  The report on the frozen section was not definitive, but what was suspicious for a malignant neoplasm and at this point  because of the adherence of the tumor to the facial nerve, it was elected not to proceed with any further dissection.   Only about half of the tumor was removed as the remaining portion of the tumor was deep to the facial nerve and deep to the division of  the facial nerve.  The main trunk of the facial nerve more proximally was spared and was really not involved and could easily dissect around the trunk of the facial nerve more proximally.  But at approximate division of the facial nerve anteriorly, the  tumor was fairly adherent in this area.  In order to completely excise this, it would require sacrificing the facial nerve.  There was not a definitive diagnosis of malignancy at this point and just probable malignancy.  It was elected to stop the case  at this point.  The extent of the tumor was marked with 2 staples or clips just inferior to the facial nerve trunk as well as the mass of the tumor that was left.  A separate single staple was placed anteriorly at the anterior extent of the facial nerve  and then another staple was placed at the superior extent of the palpable tumor just posterior to the superior branches of the facial nerve.  After marking the tumor, hemostasis was obtained with bipolar cautery.  Wound was irrigated and a 10-French  Jackson-Pratt drain was brought out through a separate stab incision posteriorly.  Defect was closed with 3-0 chromic suture subcutaneously and 5-0 nylon to reapproximate the skin edges.  This completed the procedure.  The patient was awoke from  anesthesia and I suspect she will have some weakness of the superior branches of the facial nerve as this was the most adherent area and dissection was very difficult in this area.  But we did not transect the facial nerve grossly during the procedure.   The trunk of the facial nerve was totally intact.  Stimulation of the facial nerve superiorly was a little weak, but was mobile.  This completed the procedure.  The patient was subsequently awoken from anesthesia and transferred to recovery room postop,  doing well.  She will be observed  overnight in the recovery care center and plan on discharge in the morning after removing JP drain.  Further therapy will depend on final pathology report.  TN/NUANCE  D:07/05/2017 T:07/05/2017 JOB:[id number]

## 2017-07-05 NOTE — Transfer of Care (Signed)
Immediate Anesthesia Transfer of Care Note  Patient: [facility]  Procedure(s) Performed: PAROTIDECTOMY (Left )  Patient Location: PACU  Anesthesia Type:General  Level of Consciousness: awake, alert  and oriented  Airway & Oxygen Therapy: Patient Spontanous Breathing and Patient connected to face mask oxygen  Post-op Assessment: Report given to RN and Post -op Vital signs reviewed and stable  Post vital signs: Reviewed and stable  Last Vitals:  Vitals Value Taken Time  BP    Temp    Pulse    Resp    SpO2      Last Pain:  Vitals:   07/05/17 0654  TempSrc: Oral  PainSc: 0-No pain         Complications: No apparent anesthesia complications

## 2017-07-05 NOTE — Discharge Instructions (Addendum)
Tylenol, motrin or hydrocodone tabs prn pain Apply antibiotic ointment to incision site daily Return to see Dr Lucia Gaskins at 11:30 next [place] Instructions  Activity: Get plenty of rest for the remainder of the day. A responsible individual must stay with you for 24 hours following the procedure.  For the next 24 hours, DO NOT: -Drive a car -Paediatric nurse -Drink alcoholic beverages -Take any medication unless instructed by your physician -Make any legal decisions or sign important papers.  Meals: Start with liquid foods such as gelatin or soup. Progress to regular foods as tolerated. Avoid greasy, spicy, heavy foods. If nausea and/or vomiting occur, drink only clear liquids until the nausea and/or vomiting subsides. Call your physician if vomiting continues.  Special Instructions/Symptoms: Your throat may feel dry or sore from the anesthesia or the breathing tube placed in your throat during surgery. If this causes discomfort, gargle with warm salt water. The discomfort should disappear within 24 hours.  If you had a scopolamine patch placed behind your ear for the management of post- operative nausea and/or vomiting:  1. The medication in the patch is effective for 72 hours, after which it should be removed.  Wrap patch in a tissue and discard in the trash. Wash hands thoroughly with soap and water. 2. You may remove the patch earlier than 72 hours if you experience unpleasant side effects which may include dry mouth, dizziness or visual disturbances. 3. Avoid touching the patch. Wash your hands with soap and water after contact with the patch.

## 2017-07-05 NOTE — Anesthesia Procedure Notes (Signed)
Procedure Name: Intubation Date/Time: 07/05/2017 7:40 AM Performed by: Willa Frater, CRNA Pre-anesthesia Checklist: Patient identified, Emergency Drugs available, Suction available and Patient being monitored Patient Re-evaluated:Patient Re-evaluated prior to induction Oxygen Delivery Method: Circle system utilized Preoxygenation: Pre-oxygenation with 100% oxygen Induction Type: IV induction Ventilation: Mask ventilation without difficulty Laryngoscope Size: Mac and 3 Grade View: Grade I Tube type: Oral Tube size: 7.0 mm Number of attempts: 1 Airway Equipment and Method: Stylet and Oral airway Placement Confirmation: ETT inserted through vocal cords under direct vision,  positive ETCO2 and breath sounds checked- equal and bilateral Secured at: 23 cm Tube secured with: Tape Dental Injury: Teeth and Oropharynx as per pre-operative assessment

## 2017-07-05 NOTE — Progress Notes (Signed)
Post op check Patient doing well with minimal pain. Facial nerve function on the left side is weak but patient able to close eye easily with just weak marginal n function. Frontal branch is very weak. No significant swelling. Reviewed with patient and mother concerning this will probably represent a malignant tumor although final path is pending. We will remove JP in am and d/c Follow up next Wed at my office

## 2017-07-05 NOTE — Interval H&P Note (Signed)
History and Physical Interval Note:  07/05/2017 7:26 AM  Kathryn Barnes  has presented today for surgery, with the diagnosis of neoplasam parotid tumor  The various methods of treatment have been discussed with the patient and family. After consideration of risks, benefits and other options for treatment, the patient has consented to  Procedure(s): PAROTIDECTOMY (Left) as a surgical intervention .  The patient's history has been reviewed, patient examined, no change in status, stable for surgery.  I have reviewed the patient's chart and labs.  Questions were answered to the patient's satisfaction.     [name]

## 2017-07-05 NOTE — Brief Op Note (Signed)
07/05/2017  11:12 AM  PATIENT:  Kathryn Barnes  32 y.o. female  PRE-OPERATIVE DIAGNOSIS:  neoplasam parotid tumor  POST-OPERATIVE DIAGNOSIS:  neoplasam parotid tumor, findings consistent with malignant neoplasm  PROCEDURE:  Procedure(s): PAROTIDECTOMY (Left) with facial nerve dissection  SURGEON:  Surgeon(s) and Role:    Rozetta Nunnery, MD - Primary    * Thornell Sartorius, MD - Assisting  PHYSICIAN ASSISTANT:   ASSISTANTS: Minna Merritts MD   ANESTHESIA:   general  EBL:  15 mL   BLOOD ADMINISTERED:none  DRAINS: (10) Jackson-Pratt drain(s) with closed bulb suction in the left parotid region   LOCAL MEDICATIONS USED:  XYLOCAINE with  EPI  5 cc  SPECIMEN:  Source of Specimen:  left parotid gland  DISPOSITION OF SPECIMEN:  PATHOLOGY  COUNTS:  YES  TOURNIQUET:  * No tourniquets in log *  DICTATION: .Other Dictation: Dictation Number [id number]  PLAN OF CARE: Admit for overnight observation  PATIENT DISPOSITION:  PACU - hemodynamically stable.   Delay start of Pharmacological VTE agent (>24hrs) due to surgical blood loss or risk of bleeding: yes

## 2017-07-05 NOTE — Anesthesia Postprocedure Evaluation (Signed)
Anesthesia Post Note  Patient: [employer]  Procedure(s) Performed: PAROTIDECTOMY (Left )     Patient location during evaluation: PACU Anesthesia Type: General Level of consciousness: awake and alert Pain management: pain level controlled Vital Signs Assessment: post-procedure vital signs reviewed and stable Respiratory status: spontaneous breathing, nonlabored ventilation, respiratory function stable and patient connected to nasal cannula oxygen Cardiovascular status: blood pressure returned to baseline and stable Postop Assessment: no apparent nausea or vomiting Anesthetic complications: no    Last Vitals:  Vitals:   07/05/17 1121 07/05/17 1130  BP: 118/76 (!) 123/93  Pulse: (!) 116 (!) 113  Resp: 15 16  Temp: 37.2 C   SpO2: 100% 93%    Last Pain:  Vitals:   07/05/17 1130  TempSrc:   PainSc: 0-No pain                 Beth Goodlin

## 2017-07-06 ENCOUNTER — Encounter (HOSPITAL_BASED_OUTPATIENT_CLINIC_OR_DEPARTMENT_OTHER): Payer: Self-pay | Admitting: Otolaryngology

## 2017-07-06 DIAGNOSIS — C07 Malignant neoplasm of parotid gland: Secondary | ICD-10-CM | POA: Diagnosis not present

## 2017-07-06 SURGERY — Surgical Case
Anesthesia: *Unknown

## 2017-07-06 MED ORDER — BACITRACIN ZINC 500 UNIT/GM EX OINT
TOPICAL_OINTMENT | CUTANEOUS | Status: AC
  Filled 2017-07-06: qty 28.35

## 2017-07-06 MED ORDER — HYDROCODONE-ACETAMINOPHEN 5-325 MG PO TABS: 1.0000 | ORAL_TABLET | Freq: Four times a day (QID) | ORAL | 0 refills | Status: DC | PRN

## 2017-07-06 NOTE — Progress Notes (Signed)
POD 1 AF VSS JP drain with min serous output was removed. Wound doing well Facial n weakness unchanged. Able to close eye well. Discharge to home with follow up in 5 days. Tylenol, motrin or Hydrocodone 5 mg q 6 hrs prn pain

## 2017-07-17 DIAGNOSIS — C07 Malignant neoplasm of parotid gland: Secondary | ICD-10-CM | POA: Insufficient documentation

## 2018-10-30 ENCOUNTER — Encounter: Payer: Self-pay | Admitting: Obstetrics

## 2018-10-30 ENCOUNTER — Other Ambulatory Visit: Payer: Self-pay

## 2018-10-30 ENCOUNTER — Ambulatory Visit (INDEPENDENT_AMBULATORY_CARE_PROVIDER_SITE_OTHER): Payer: Medicaid Other | Admitting: Obstetrics

## 2018-10-30 ENCOUNTER — Other Ambulatory Visit (HOSPITAL_COMMUNITY)
Admission: RE | Admit: 2018-10-30 | Discharge: 2018-10-30 | Disposition: A | Discharge status: Home / Self Care | Payer: Medicaid Other | Source: Ambulatory Visit | Attending: Obstetrics | Admitting: Obstetrics

## 2018-10-30 VITALS — BP 95/61 | HR 79 | Wt 132.0 lb

## 2018-10-30 DIAGNOSIS — Z3480 Encounter for supervision of other normal pregnancy, unspecified trimester: Secondary | ICD-10-CM | POA: Insufficient documentation

## 2018-10-30 DIAGNOSIS — O339 Maternal care for disproportion, unspecified: Secondary | ICD-10-CM | POA: Diagnosis not present

## 2018-10-30 DIAGNOSIS — Z3A24 24 weeks gestation of pregnancy: Secondary | ICD-10-CM

## 2018-10-30 DIAGNOSIS — M549 Dorsalgia, unspecified: Secondary | ICD-10-CM

## 2018-10-30 DIAGNOSIS — O09292 Supervision of pregnancy with other poor reproductive or obstetric history, second trimester: Secondary | ICD-10-CM

## 2018-10-30 DIAGNOSIS — O0992 Supervision of high risk pregnancy, unspecified, second trimester: Secondary | ICD-10-CM

## 2018-10-30 DIAGNOSIS — O0990 Supervision of high risk pregnancy, unspecified, unspecified trimester: Secondary | ICD-10-CM | POA: Insufficient documentation

## 2018-10-30 DIAGNOSIS — O09299 Supervision of pregnancy with other poor reproductive or obstetric history, unspecified trimester: Secondary | ICD-10-CM

## 2018-10-30 MED ORDER — PRENATE MINI 29-0.6-0.4-350 MG PO CAPS: 1.0000 | ORAL_CAPSULE | Freq: Every day | ORAL | 4 refills | Status: DC

## 2018-10-30 MED ORDER — COMFORT FIT MATERNITY SUPP SM MISC: 0 refills | Status: DC

## 2018-10-30 MED ORDER — PRENATE PIXIE 10-0.6-0.4-200 MG PO CAPS: 1.0000 | ORAL_CAPSULE | Freq: Every day | ORAL | 4 refills | Status: DC

## 2018-10-30 MED ORDER — BLOOD PRESSURE MONITOR KIT: 1.0000 | PACK | Freq: Once | 0 refills | Status: AC

## 2018-10-30 MED ORDER — ASPIRIN 81 MG PO CHEW: 162.0000 mg | CHEWABLE_TABLET | Freq: Every day | ORAL | 5 refills | Status: DC

## 2018-10-30 NOTE — Progress Notes (Signed)
Subjective:    Kathryn Barnes is being seen today for her first obstetrical visit.  This is not a planned pregnancy. She is at [redacted]w[redacted]d gestation. Her obstetrical history is significant for pre-eclampsia. Relationship with FOB: unknown. Patient does intend to breast feed. Pregnancy history fully reviewed.  The information documented in the HPI was reviewed and verified.  Menstrual History: OB History    Gravida  2   Para  1   Term  1   Preterm      AB      Living  1     SAB      TAB      Ectopic      Multiple  0   Live Births  1            Patient's last menstrual period was 05/10/2018.    Past Medical History:  Diagnosis Date  . Anxiety    no recent panic attacks  . Hypertension    during pregnancy- not taking any meds at this time  . Medical history non-contributory   . SVD (spontaneous vaginal delivery) 03/14/2017    Past Surgical History:  Procedure Laterality Date  . NO PAST SURGERIES    . PAROTIDECTOMY Left 07/05/2017   Procedure: PAROTIDECTOMY;  Surgeon: Newman, Christopher E, MD;  Location:  SURGERY CENTER;  Service: ENT;  Laterality: Left;    (Not in a hospital admission)  Allergies  Allergen Reactions  . Robaxin [Methocarbamol] Hives    Social History   Tobacco Use  . Smoking status: Never Smoker  . Smokeless tobacco: Never Used  Substance Use Topics  . Alcohol use: No    Frequency: Never    Family History  Problem Relation Age of Onset  . Diabetes Maternal Grandmother   . Hypertension Maternal Grandmother   . Stroke Maternal Grandmother   . Anemia Mother   . Anxiety disorder Maternal Aunt   . Alcohol abuse Maternal Grandfather      Review of Systems Constitutional: negative for weight loss Gastrointestinal: negative for vomiting Genitourinary:negative for genital lesions and vaginal discharge and dysuria Musculoskeletal:negative for back pain Behavioral/Psych: negative for abusive relationship, depression, illegal  drug usage and tobacco use    Objective:    BP 95/61   Pulse 79   Wt 132 lb (59.9 kg)   LMP 05/10/2018   BMI 22.31 kg/m  General Appearance:    Alert, cooperative, no distress, appears stated age  Head:    Normocephalic, without obvious abnormality, atraumatic  Eyes:    PERRL, conjunctiva/corneas clear, EOM's intact, fundi    benign, both eyes  Ears:    Normal TM's and external ear canals, both ears  Nose:   Nares normal, septum midline, mucosa normal, no drainage    or sinus tenderness  Throat:   Lips, mucosa, and tongue normal; teeth and gums normal  Neck:   Supple, symmetrical, trachea midline, no adenopathy;    thyroid:  no enlargement/tenderness/nodules; no carotid   bruit or JVD  Back:     Symmetric, no curvature, ROM normal, no CVA tenderness  Lungs:     Clear to auscultation bilaterally, respirations unlabored  Chest Wall:    No tenderness or deformity   Heart:    Regular rate and rhythm, S1 and S2 normal, no murmur, rub   or gallop  Breast Exam:    No tenderness, masses, or nipple abnormality  Abdomen:     Soft, non-tender, bowel sounds active all four quadrants,      no masses, no organomegaly  Genitalia:    Normal female without lesion, discharge or tenderness  Extremities:   Extremities normal, atraumatic, no cyanosis or edema  Pulses:   2+ and symmetric all extremities  Skin:   Skin color, texture, turgor normal, no rashes or lesions  Lymph nodes:   Cervical, supraclavicular, and axillary nodes normal  Neurologic:   CNII-XII intact, normal strength, sensation and reflexes    throughout      Lab Review Urine pregnancy test Labs reviewed yes Radiologic studies reviewed yes  Assessment:    Pregnancy at 51w5dweeks    Plan:     1. Supervision of high risk pregnancy, antepartum Rx: - Cytology - PAP( [place]) - Cervicovaginal ancillary only( [place]) - Obstetric Panel, Including HIV - Culture, OB Urine - [place]FM OB DETAIL +14 WK; Future -  Prenat-FeAsp-Meth-FA-DHA w/o A (PRENATE PIXIE) 10-0.6-0.4-200 MG CAPS; Take 1 capsule by mouth daily before breakfast.  Dispense: 90 capsule; Refill: 4 - Hemoglobinopathy evaluation - Prenat w/o A-FeCbn-Meth-FA-DHA (PRENATE MINI) 29-0.6-0.4-350 MG CAPS; Take 1 capsule by mouth daily before breakfast.  Dispense: 90 capsule; Refill: 4 - Blood Pressure Monitor KIT; 1 kit by Does not apply route once for 1 dose.  Dispense: 1 kit; Refill: 0  2. Hx of pre-eclampsia in prior pregnancy, currently pregnant Rx: - aspirin 81 MG chewable tablet; Chew 2 tablets (162 mg total) by mouth daily.  Dispense: 30 tablet; Refill: 5 - Blood Pressure Monitor KIT; 1 kit by Does not apply route once for 1 dose.  Dispense: 1 kit; Refill: 0  3. Backache symptom Rx: - Elastic Bandages & Supports (COMFORT FIT MATERNITY SUPP SM) MISC; Wear as directed.  Dispense: 1 each; Refill: 0   Prenatal vitamins.  Counseling provided regarding continued use of seat belts, cessation of alcohol consumption, smoking or use of illicit drugs; infection precautions i.e., influenza/TDAP immunizations, toxoplasmosis,CMV, parvovirus, listeria and varicella; workplace safety, exercise during pregnancy; routine dental care, safe medications, sexual activity, hot tubs, saunas, pools, travel, caffeine use, fish and methlymercury, potential toxins, hair treatments, varicose veins Weight gain recommendations per IOM guidelines reviewed: underweight/BMI< 18.5--> gain 28 - 40 lbs; normal weight/BMI 18.5 - 24.9--> gain 25 - 35 lbs; overweight/BMI 25 - 29.9--> gain 15 - 25 lbs; obese/BMI >30->gain  11 - 20 lbs Problem list reviewed and updated. FIRST/CF mutation testing/NIPT/QUAD SCREEN/fragile X/Ashkenazi Jewish population testing/Spinal muscular atrophy discussed: requested. Role of ultrasound in pregnancy discussed; fetal survey: requested. Amniocentesis discussed: not indicated.  Meds ordered this encounter  Medications  . Prenat-FeAsp-Meth-FA-DHA  w/o A (PRENATE PIXIE) 10-0.6-0.4-200 MG CAPS    Sig: Take 1 capsule by mouth daily before breakfast.    Dispense:  90 capsule    Refill:  4  . Elastic Bandages & Supports (COMFORT FIT MATERNITY SUPP SM) MISC    Sig: Wear as directed.    Dispense:  1 each    Refill:  0  . aspirin 81 MG chewable tablet    Sig: Chew 2 tablets (162 mg total) by mouth daily.    Dispense:  30 tablet    Refill:  5  . Prenat w/o A-FeCbn-Meth-FA-DHA (PRENATE MINI) 29-0.6-0.4-350 MG CAPS    Sig: Take 1 capsule by mouth daily before breakfast.    Dispense:  90 capsule    Refill:  4  . Blood Pressure Monitor KIT    Sig: 1 kit by Does not apply route once for 1 dose.    Dispense:  1 kit  Refill:  0   Orders Placed This Encounter  Procedures  . Culture, OB Urine  . US MFM OB DETAIL +14 WK    Standing Status:   Future    Standing Expiration Date:   12/30/2019    Order Specific Question:   Reason for Exam (SYMPTOM  OR DIAGNOSIS REQUIRED)    Answer:   anatomy, late to care    Order Specific Question:   Preferred Location    Answer:   [facility]  . Obstetric Panel, Including HIV  . Hemoglobinopathy evaluation    Follow up in 4 weeks. 50% of 25 min visit spent on counseling and coordination of care.    Harper, Charles A, MD 10/30/2018 1:32 PM  

## 2018-11-01 LAB — OBSTETRIC PANEL, INCLUDING HIV
Antibody Screen: NEGATIVE
Basophils Absolute: 0 10*3/uL (ref 0.0–0.2)
Basos: 1 %
EOS (ABSOLUTE): 0.1 10*3/uL (ref 0.0–0.4)
Eos: 1 %
HIV Screen 4th Generation wRfx: NONREACTIVE
Hematocrit: 33.2 % — ABNORMAL LOW (ref 34.0–46.6)
Hemoglobin: 10.9 g/dL — ABNORMAL LOW (ref 11.1–15.9)
Hepatitis B Surface Ag: NEGATIVE
Immature Grans (Abs): 0 10*3/uL (ref 0.0–0.1)
Immature Granulocytes: 0 %
Lymphocytes Absolute: 0.7 10*3/uL (ref 0.7–3.1)
Lymphs: 16 %
MCH: 28.6 pg (ref 26.6–33.0)
MCHC: 32.8 g/dL (ref 31.5–35.7)
MCV: 87 fL (ref 79–97)
Monocytes Absolute: 0.2 10*3/uL (ref 0.1–0.9)
Monocytes: 5 %
Neutrophils Absolute: 3.3 10*3/uL (ref 1.4–7.0)
Neutrophils: 77 %
Platelets: 250 10*3/uL (ref 150–450)
RBC: 3.81 x10E6/uL (ref 3.77–5.28)
RDW: 14 % (ref 11.7–15.4)
RPR Ser Ql: NONREACTIVE
Rh Factor: POSITIVE
Rubella Antibodies, IGG: 1.33 index (ref >0.99)
WBC: 4.3 10*3/uL (ref 3.4–10.8)

## 2018-11-01 LAB — HEMOGLOBINOPATHY EVALUATION
HGB C: 0 %
HGB S: 0 %
HGB VARIANT: 0 %
Hemoglobin A2 Quantitation: 2.3 % (ref 1.8–3.2)
Hemoglobin F Quantitation: 0 % (ref 0.0–2.0)
Hgb A: 97.7 % (ref 96.4–98.8)

## 2018-11-01 LAB — CYTOLOGY - PAP
Comment: NEGATIVE
Diagnosis: NEGATIVE
High risk HPV: NEGATIVE

## 2018-11-03 LAB — URINE CULTURE, OB REFLEX

## 2018-11-03 LAB — CULTURE, OB URINE

## 2018-11-07 ENCOUNTER — Telehealth: Payer: Self-pay

## 2018-11-07 ENCOUNTER — Other Ambulatory Visit: Payer: Self-pay | Admitting: Obstetrics

## 2018-11-07 DIAGNOSIS — B9689 Other specified bacterial agents as the cause of diseases classified elsewhere: Secondary | ICD-10-CM

## 2018-11-07 DIAGNOSIS — N760 Acute vaginitis: Secondary | ICD-10-CM

## 2018-11-07 LAB — CERVICOVAGINAL ANCILLARY ONLY
Bacterial Vaginitis (gardnerella): POSITIVE — AB
Candida Glabrata: NEGATIVE
Candida Vaginitis: NEGATIVE
Chlamydia: NEGATIVE
Comment: NEGATIVE
Comment: NEGATIVE
Comment: NEGATIVE
Comment: NEGATIVE
Comment: NEGATIVE
Comment: NORMAL
Neisseria Gonorrhea: NEGATIVE
Trichomonas: NEGATIVE

## 2018-11-07 MED ORDER — TINIDAZOLE 500 MG PO TABS: 1000.0000 mg | ORAL_TABLET | Freq: Every day | ORAL | 2 refills | Status: DC

## 2018-11-07 NOTE — Telephone Encounter (Signed)
Called to advise of results and rx sent, no answer, left vm 

## 2018-11-07 NOTE — Telephone Encounter (Signed)
S/w pt and advised of results and rx sent. 

## 2018-11-08 ENCOUNTER — Other Ambulatory Visit (HOSPITAL_COMMUNITY): Payer: Self-pay | Admitting: *Deleted

## 2018-11-08 ENCOUNTER — Encounter (HOSPITAL_COMMUNITY): Payer: Self-pay | Admitting: *Deleted

## 2018-11-08 ENCOUNTER — Ambulatory Visit (HOSPITAL_COMMUNITY): Payer: Self-pay | Admitting: Genetic Counselor

## 2018-11-08 ENCOUNTER — Ambulatory Visit (HOSPITAL_BASED_OUTPATIENT_CLINIC_OR_DEPARTMENT_OTHER): Payer: Medicaid Other | Admitting: Genetic Counselor

## 2018-11-08 ENCOUNTER — Ambulatory Visit (HOSPITAL_COMMUNITY): Payer: Medicaid Other | Admitting: *Deleted

## 2018-11-08 ENCOUNTER — Ambulatory Visit (HOSPITAL_COMMUNITY)
Admission: RE | Admit: 2018-11-08 | Discharge: 2018-11-08 | Disposition: A | Discharge status: Home / Self Care | Payer: Medicaid Other | Source: Ambulatory Visit | Attending: Obstetrics | Admitting: Obstetrics

## 2018-11-08 ENCOUNTER — Other Ambulatory Visit: Payer: Self-pay

## 2018-11-08 DIAGNOSIS — Z3480 Encounter for supervision of other normal pregnancy, unspecified trimester: Secondary | ICD-10-CM | POA: Insufficient documentation

## 2018-11-08 DIAGNOSIS — O283 Abnormal ultrasonic finding on antenatal screening of mother: Secondary | ICD-10-CM | POA: Insufficient documentation

## 2018-11-08 DIAGNOSIS — O0932 Supervision of pregnancy with insufficient antenatal care, second trimester: Secondary | ICD-10-CM

## 2018-11-08 DIAGNOSIS — O359XX0 Maternal care for (suspected) fetal abnormality and damage, unspecified, not applicable or unspecified: Secondary | ICD-10-CM

## 2018-11-08 DIAGNOSIS — Z3A26 26 weeks gestation of pregnancy: Secondary | ICD-10-CM

## 2018-11-08 DIAGNOSIS — O351XX0 Maternal care for (suspected) chromosomal abnormality in fetus, not applicable or unspecified: Secondary | ICD-10-CM | POA: Diagnosis not present

## 2018-11-08 DIAGNOSIS — O3510X0 Maternal care for (suspected) chromosomal abnormality in fetus, unspecified, not applicable or unspecified: Secondary | ICD-10-CM

## 2018-11-08 DIAGNOSIS — O09292 Supervision of pregnancy with other poor reproductive or obstetric history, second trimester: Secondary | ICD-10-CM | POA: Diagnosis not present

## 2018-11-08 DIAGNOSIS — Z315 Encounter for genetic counseling: Secondary | ICD-10-CM | POA: Diagnosis not present

## 2018-11-08 DIAGNOSIS — O0990 Supervision of high risk pregnancy, unspecified, unspecified trimester: Secondary | ICD-10-CM | POA: Diagnosis not present

## 2018-11-08 DIAGNOSIS — O365990 Maternal care for other known or suspected poor fetal growth, unspecified trimester, not applicable or unspecified: Secondary | ICD-10-CM

## 2018-11-08 NOTE — Progress Notes (Signed)
11/08/2018  Kathryn Barnes Jul 26, 1985 MRN: [id number] DOV: 11/08/2018  Kathryn Barnes presented to the [facility] for a genetics consultation regarding ultrasound anomalies concerning for fetal aneuploidy. Kathryn Barnes came to her appointment alone due to COVID-19 visitor restrictions.   Indication for genetic counseling - Ultrasound anomalies concerning for fetal aneuploidy  Prenatal history  Kathryn Barnes is a G2P3116, 33 y.o. year old female. Her current pregnancy has completed [redacted]w[redacted]d (Estimated Date of Delivery: 02/14/19).  Kathryn Barnes denied exposure to environmental toxins or chemical agents. She denied the use of tobacco or street drugs. She reported occasional alcohol use until she found out she was pregnant. She reported taking prenatal vitamins and baby aspirin. She denied significant viral illnesses, fevers, and bleeding during the course of her pregnancy. Her medical and surgical histories were noncontributory.  Family History  A three generation pedigree was drafted and reviewed. 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 Kathryn Barnes completed cancer treatment in November 2019.   - Kathryn Barnes was born with a "hole in her heart" that closed spontaneously. The exact etiology of this congenital heart defect (CHD) is unknown and records are not available. There are multifactorial causes for isolated CHDs, including environmental and genetic factors. As such, the recurrence risk for first degree relatives is elevated over the general population incidence of 1/200 (0.5%). We discussed that without knowing the exact etiology, the risk for a child of an affected sibling to have a CHD is approximately 4%. A fetal echocardiogram to assess for CHDs in  the current pregnancy is recommended.  The remaining family histories were reviewed and found to be noncontributory for birth defects, intellectual disability, recurrent pregnancy loss, and known genetic conditions. Kathryn Barnes had limited information about her partner's family history; thus, risk assessment was limited.  The patient's ethnicity is African American. The father of the pregnancy's ethnicity is African American and Caucasian. Consanguinity was denied. Kathryn Barnes was unsure of whether her partner has any Ashkenazi Jewish ancestry. Pedigree will be scanned under Media.  Discussion  Kathryn Barnes was seen for genetic counseling due to the presence of multiple soft markers and anomalies associated with fetal aneuploidy on ultrasound. On Kathryn Barnes's detailed anatomy ultrasound performed today, an absent nasal bone, short long bones, atrioventricular septal defect, and fetal growth restriction were noted. The ultrasound report will be documented separately.  We discussed that the second trimester anatomy ultrasound is targeted at identifying congenital birth defects and features associated with aneuploidy. It has evolved as a screening tool used to provide an individualized risk assessment for Down syndrome and other trisomies. The ability of ultrasound to aid in the detection of aneuploidies relies on identification of both major structural anomalies and "soft markers." The term "soft markers" refers to findings that are often normal variants and do not cause any significant medical problems for a baby postnatally. Nonetheless, these soft markers have known associations with aneuploidy.    Kathryn Barnes was counseled that an absent nasal bone is a highly sensitive and specific soft marker for Down syndrome. It is present in approximately 1% of chromosomally normal fetuses, but up to 70% of fetuses with Down syndrome, 55% with trisomy 18, and 34% with trisomy 56. The presence and length of the  fetal nasal bone varies by  race and ethnicity in second trimester fetuses. Shortened long bones (femur and/or humerus) is present in 16% of fetuses with Down syndrome, but can also be noted in fetuses with trisomies 83 or 37 and fetuses with other genetic conditions. Likelihood ratios for fetal Down syndrome are 23.27, 3.72, and 5.8 for absent nasal bone, short femur, and short humerus length respectively (Agathokleous et al., 2013). Given her age-related risk to have a fetus with Down syndrome (1 in 47) and the identified soft markers, the adjusted risk for fetal Down syndrome in the current pregnancy was recalculated to be 86%.  In addition to the soft markers described above, an atrioventricular septal defect (AVSD) was identified on today's ultrasound. An AVSD is a congenital heart defect in which there are holes between the chambers of the right and left sides of the heart. Additionally, the valves that control the flow of blood between these chambers may not be formed correctly. There is a strong association between AVSDs and Down syndrome, with a 40-50% risk of Down syndrome in fetuses in whom an AVSD is detected. More rarely, fetuses with trisomy 56 or trisomy 18 may also demonstrate AVSDs. A fetal echocardiogram is recommended to more precisely characterize the identified AVSD. Additionally, a fetal echocardiogram can be useful in surgical planning for the repair of AVSDs.  Fetal growth restriction was also noted on today's ultrasound. Fetal growth restriction can be seen in pregnancies affected by chromosomal aneuploidies such as Down syndrome. However, fetal growth restriction can also be associated with placental abnormalities, umbilical cord abnormalities, prenatal infections, and other pregnancy complications. The presence of the AVSD as well as fetal growth restriction further increase the suspicion for Down syndrome in the current fetus.  Considering the high suspicion of Down syndrome, Ms.  Barnes was counseled in detail regarding this condition. Down syndrome is one of the most common extra chromosome conditions, as approximately 1 in 800 babies are born with this condition. There are different types of Down syndrome, with each type determined by the arrangement of the chromosome 21 pair. Approximately 95% of cases are caused by an entire extra copy of chromosome 21 (trisomy 21), and 2-4% of cases are due to a chromosomal rearrangement (translocation) involving chromosome 21. We reviewed that Down syndrome most commonly occurs by chance due to an error in chromosomal division during the formation of egg and sperm cells in a process called nondisjunction.   Down syndrome is characterized by a distinctive facial appearance, mild to moderate intellectual disability, and an increased chance for a heart defect. Approximately half of babies with Down syndrome are born with a heart defect that may require surgery after birth. While many children with Down syndrome look similar to each other, each child with Down syndrome is unique and will have many more features in common with his or her own family members. Children with Down syndrome also have an increased chance for thyroid problems, which can range from an underactive to an overactive thyroid. Additionally, low muscle tone, vision problems, and respiratory and ear infections are more common among babies with Down syndrome. We discussed that there are many more features that can be associated with Down syndrome; however, it is not possible to accurately predict all features that would be present in an individual with Down syndrome prenatally. Additionally, there is a high degree of variability seen among children who have this condition, meaning that every child with Down syndrome will not be affected in exactly the same way, and some children  will have more or less features than others.  Kathryn Barnes was also counseled that there is an increased  fetal loss rate associated with Down syndrome, with approximately 20% of affected babies being stillborn. Of affected liveborn infants, 90% survive the first 10 years of life. With the advances in medical technology, early intervention, and supportive therapies, many individuals with Down syndrome are able to live with an increasing degree of independence. Today, many adults with Down syndrome care for themselves, have jobs, and often  live in group homes or apartments where assistance is available if needed.  We reviewed noninvasive prenatal screening (NIPS) as an available follow-up screening option to further characterize the risk for Down syndrome or another chromosomal aneuploidy in the current pregnancy. Specifically, we discussed that NIPS analyzes cell free fetal DNA found in the maternal blood circulation during pregnancy. This test is not diagnostic for chromosome conditions, but can provide information regarding the presence or absence of extra fetal DNA for chromosomes 13, 18 and 21. Thus, it would not identify or rule out all fetal aneuploidy. The reported detection rate is greater than 99% for trisomy 21, greater than 98% for trisomy 18, and greater than 99% for trisomy 57. The false positive rate is reported to be less than 0.1% for any of these conditions. We reviewed that while NIPS is sensitive at detecting cases of Down syndrome, it still cannot be considered diagnostic. Of note, maternal neoplasms such as cancer and benign uterine fibroids can also affect NIPS results. Given that Kathryn Barnes completed her cancer treatment last year, it is unlikely that that cell-free DNA from a maternal neoplasm would still be present in blood circulation. However, Kathryn Barnes's history of cancer should be taken into consideration when interpreting NIPS results.  Ms. Krocker was also counseled regarding diagnostic testing via amniocentesis. We discussed the technical aspects of the procedure and quoted up to  a 1 in 500 (0.2%) risk for preterm labor or other adverse pregnancy outcomes as a result of amniocentesis. Cultured cells from an amniocentesis sample allow for the visualization of a fetal karyotype, which can detect >99% of chromosomal aberrations including trisomy 21. Chromosomal microarray can also be performed to identify smaller deletions or duplications offetalchromosomal material. KathrynSylvester was informed that diagnostic testing would be the only way to definitivelydetermine if the fetus has Down syndromeprenatally. She was also made aware that she has the option to wait to pursue genetic testing postnatally if desired.  Ms. Mirenda was understandably overwhelmed with all of the information presented today and requested more time to consider her available screening/testing options. She indicated that she would likely consider NIPS over amniocentesis if she pursues any testing at all, as NIPS is not associated with a risk for adverse pregnancy outcomes, and the increased risk for stillbirth associated with Down syndrome was already anxiety-provoking enough for her. Ms. Seelinger inquired about the benefits of prenatal screening/diagnosis for Down syndrome, as she disclosed that a positive result for Down syndrome would not impact her pregnancy management. We discussed that a prenatal diagnosis would allow for delivery planning, prenatal consults with specialists that would be involved in her infant's care, earlier access to support resources, and time to plan and prepare emotionally, physically, and financially. Additionally, diagnostic testing can help to inform recurrence risks for future pregnancies. Ms. Geeslin and I made a plan for me to call her next week and see if she has made a decision about testing once she has had more time to process the  information presented today and discuss her options with her partner. Testing can be facilitated at her follow-up ultrasound appointment next week if  desired. Ms. Eimers was agreeable to this plan.  I provided Ms. Locher with a variety of resources, including information about Down syndrome from [place], as well as information about Down syndrome-related support resources from [employer], the National Down Syndrome Society, and the Down [place]. I reminded Ms. Muralles that her fetus has not definitively been diagnosed with Down syndrome but that resources are available should she be interested in perusing them when making her testing decision.  I counseled Ms. Karstens regarding the above risks and available options. The approximate face-to-face time with the genetic counselor was 45 minutes.  In summary:  Discussed ultrasound findings  Absent nasal bone, short femur length, short humerus length, atrioventricular septal defect, and fetal growth restriction were identified  Findings suspicious for Down syndrome in the current pregnancy  Offered additional testing and screening  Offered NIPS, amniocentesis, and option of postnatal testing  Patient requested more time to make decision. I will check in with her next week  Reviewed family history concerns   Buelah Manis, [place]

## 2018-11-08 NOTE — Progress Notes (Signed)
[phone]  

## 2018-11-11 ENCOUNTER — Other Ambulatory Visit: Payer: Self-pay | Admitting: *Deleted

## 2018-11-11 DIAGNOSIS — N760 Acute vaginitis: Secondary | ICD-10-CM

## 2018-11-11 DIAGNOSIS — B9689 Other specified bacterial agents as the cause of diseases classified elsewhere: Secondary | ICD-10-CM

## 2018-11-11 MED ORDER — METRONIDAZOLE 500 MG PO TABS: 500.0000 mg | ORAL_TABLET | Freq: Two times a day (BID) | ORAL | 0 refills | Status: DC

## 2018-11-11 NOTE — Progress Notes (Signed)
Prior Auth received from pharmacy for Tinidazole.   Cannot give PA for Rx as pt has not failed on Flagyl per chart.  Flagyl was sent in today, if pt fails on this Rx she may qualify for Tinidazole PA. Pharmacy made aware of change in Rx.

## 2018-11-12 DIAGNOSIS — O35BXX0 Maternal care for other (suspected) fetal abnormality and damage, fetal cardiac anomalies, not applicable or unspecified: Secondary | ICD-10-CM

## 2018-11-12 DIAGNOSIS — O358XX0 Maternal care for other (suspected) fetal abnormality and damage, not applicable or unspecified: Secondary | ICD-10-CM

## 2018-11-12 HISTORY — DX: Maternal care for other (suspected) fetal abnormality and damage, fetal cardiac anomalies, not applicable or unspecified: O35.BXX0

## 2018-11-12 HISTORY — DX: Maternal care for other (suspected) fetal abnormality and damage, not applicable or unspecified: O35.8XX0

## 2018-11-13 ENCOUNTER — Telehealth (HOSPITAL_COMMUNITY): Payer: Self-pay | Admitting: Genetic Counselor

## 2018-11-13 ENCOUNTER — Encounter (HOSPITAL_COMMUNITY): Payer: Self-pay | Admitting: Obstetrics and Gynecology

## 2018-11-13 NOTE — Telephone Encounter (Signed)
I called Ms. Kathryn Barnes to check in on her following our genetic counseling session last Friday. Ms. Kathryn Barnes indicated that she was feeling very overwhelmed on Friday but is feeling much better now and has come to terms with the potential diagnosis of Down syndrome in her baby. She had her fetal echocardiogram appointment yesterday which she said was very informative. She understands that her daughter will need heart surgery at some point after birth, but is relieved that the cardiac defect will likely remain stable throughout the pregnancy. We discussed that we will continue to closely monitor her baby to ensure that she is getting the best care possible throughout the remainder of her pregnancy.  Ms. Kathryn Barnes indicated that she would like to wait until the time of birth to determine if her daughter has Down syndrome; she does not desire noninvasive prenatal screening (NIPS) or amniocentesis. I validated that decision as it is clear that Ms. Kathryn Barnes has thought hard about what is best for her and her own personal circumstances. We will discuss Ms. Kathryn Barnes at our monthly Multidisciplinary Antenatal Advisory Committee [facility]) meeting to ensure that the providers who will be involved in Ms. Kathryn Barnes's prenatal and postnatal care are all aware of the baby's possible diagnosis of Down syndrome.   Overall, Ms. Kathryn Barnes was in much higher spirits than she was when I spoke with her last week. She seems to have come to peace with the identified ultrasound findings and possible diagnosis of Down syndrome in the pregnancy. Ms. Kathryn Barnes did not have any questions for me at this time. I encouraged her to reach out to me at any time should any questions arise or should she desire any more support resources.  Buelah Manis, MS Genetic Counselor

## 2018-11-15 ENCOUNTER — Ambulatory Visit (HOSPITAL_COMMUNITY)
Admission: RE | Admit: 2018-11-15 | Discharge: 2018-11-15 | Disposition: A | Discharge status: Home / Self Care | Payer: Medicaid Other | Source: Ambulatory Visit | Attending: Obstetrics and Gynecology | Admitting: Obstetrics and Gynecology

## 2018-11-15 ENCOUNTER — Encounter (HOSPITAL_COMMUNITY): Payer: Self-pay

## 2018-11-15 ENCOUNTER — Ambulatory Visit (HOSPITAL_COMMUNITY): Payer: Medicaid Other | Admitting: *Deleted

## 2018-11-15 ENCOUNTER — Other Ambulatory Visit: Payer: Self-pay

## 2018-11-15 DIAGNOSIS — O0932 Supervision of pregnancy with insufficient antenatal care, second trimester: Secondary | ICD-10-CM | POA: Diagnosis not present

## 2018-11-15 DIAGNOSIS — O365990 Maternal care for other known or suspected poor fetal growth, unspecified trimester, not applicable or unspecified: Secondary | ICD-10-CM | POA: Diagnosis not present

## 2018-11-15 DIAGNOSIS — O09292 Supervision of pregnancy with other poor reproductive or obstetric history, second trimester: Secondary | ICD-10-CM

## 2018-11-15 DIAGNOSIS — O359XX0 Maternal care for (suspected) fetal abnormality and damage, unspecified, not applicable or unspecified: Secondary | ICD-10-CM

## 2018-11-15 DIAGNOSIS — Z3480 Encounter for supervision of other normal pregnancy, unspecified trimester: Secondary | ICD-10-CM | POA: Diagnosis not present

## 2018-11-15 DIAGNOSIS — Z3A27 27 weeks gestation of pregnancy: Secondary | ICD-10-CM

## 2018-11-15 DIAGNOSIS — O365920 Maternal care for other known or suspected poor fetal growth, second trimester, not applicable or unspecified: Secondary | ICD-10-CM | POA: Diagnosis not present

## 2018-11-15 NOTE — Procedures (Signed)
Kathryn Barnes 04-07-1985 [redacted]w[redacted]d  Fetus A Non-Stress Test Interpretation for 11/15/18  Indication: IUGR  Fetal Heart Rate A Mode: External Baseline Rate (A): 140 bpm Variability: Moderate Accelerations: 10 x 10 Decelerations: Variable Multiple birth?: No  Uterine Activity Mode: Toco Contraction Frequency (min): mild UI noted Contraction Duration (sec): 10-40 Contraction Quality: Mild Resting Tone Palpated: Relaxed Resting Time: Adequate  Interpretation (Fetal Testing) Nonstress Test Interpretation: Reactive Comments: FHR tracing rev'd by Dr. Donalee Citrin

## 2018-11-22 ENCOUNTER — Ambulatory Visit (HOSPITAL_COMMUNITY)
Admission: RE | Admit: 2018-11-22 | Discharge: 2018-11-22 | Disposition: A | Discharge status: Home / Self Care | Payer: Medicaid Other | Source: Ambulatory Visit | Attending: Obstetrics and Gynecology | Admitting: Obstetrics and Gynecology

## 2018-11-22 ENCOUNTER — Other Ambulatory Visit: Payer: Self-pay

## 2018-11-22 ENCOUNTER — Encounter (HOSPITAL_COMMUNITY): Payer: Self-pay

## 2018-11-22 ENCOUNTER — Ambulatory Visit (HOSPITAL_COMMUNITY): Payer: Medicaid Other | Admitting: *Deleted

## 2018-11-22 DIAGNOSIS — Z3480 Encounter for supervision of other normal pregnancy, unspecified trimester: Secondary | ICD-10-CM | POA: Insufficient documentation

## 2018-11-22 DIAGNOSIS — O359XX0 Maternal care for (suspected) fetal abnormality and damage, unspecified, not applicable or unspecified: Secondary | ICD-10-CM

## 2018-11-22 DIAGNOSIS — O365990 Maternal care for other known or suspected poor fetal growth, unspecified trimester, not applicable or unspecified: Secondary | ICD-10-CM | POA: Insufficient documentation

## 2018-11-22 DIAGNOSIS — O0933 Supervision of pregnancy with insufficient antenatal care, third trimester: Secondary | ICD-10-CM

## 2018-11-22 DIAGNOSIS — Z3A28 28 weeks gestation of pregnancy: Secondary | ICD-10-CM | POA: Diagnosis not present

## 2018-11-22 DIAGNOSIS — O09293 Supervision of pregnancy with other poor reproductive or obstetric history, third trimester: Secondary | ICD-10-CM | POA: Diagnosis not present

## 2018-11-27 ENCOUNTER — Ambulatory Visit (INDEPENDENT_AMBULATORY_CARE_PROVIDER_SITE_OTHER): Payer: Medicaid Other | Admitting: Obstetrics & Gynecology

## 2018-11-27 ENCOUNTER — Other Ambulatory Visit: Payer: Self-pay

## 2018-11-27 ENCOUNTER — Encounter: Payer: Self-pay | Admitting: Obstetrics and Gynecology

## 2018-11-27 ENCOUNTER — Other Ambulatory Visit: Payer: Medicaid Other

## 2018-11-27 ENCOUNTER — Encounter: Payer: Self-pay | Admitting: Obstetrics & Gynecology

## 2018-11-27 DIAGNOSIS — Z3483 Encounter for supervision of other normal pregnancy, third trimester: Secondary | ICD-10-CM

## 2018-11-27 DIAGNOSIS — Z3480 Encounter for supervision of other normal pregnancy, unspecified trimester: Secondary | ICD-10-CM

## 2018-11-27 DIAGNOSIS — O365990 Maternal care for other known or suspected poor fetal growth, unspecified trimester, not applicable or unspecified: Secondary | ICD-10-CM

## 2018-11-27 DIAGNOSIS — Z3A28 28 weeks gestation of pregnancy: Secondary | ICD-10-CM

## 2018-11-27 HISTORY — DX: Maternal care for other known or suspected poor fetal growth, unspecified trimester, not applicable or unspecified: O36.5990

## 2018-11-27 NOTE — Progress Notes (Signed)
Patient reports fetal movement, denies pain. 

## 2018-11-27 NOTE — Progress Notes (Signed)
   PRENATAL VISIT NOTE  Subjective:  Kathryn Barnes is a 33 y.o. G2P1001 at [redacted]w[redacted]d being seen today for ongoing prenatal care.  She is currently monitored for the following issues for this high-risk pregnancy and has Hx of preeclampsia, prior pregnancy, currently pregnant; Pregnant; Parotid tumor; Supervision of other normal pregnancy, antepartum; and Pregnancy affected by fetal growth restriction on their problem list.  Patient reports no complaints.  Contractions: Not present. Vag. Bleeding: None.  Movement: Present. Denies leaking of fluid.   The following portions of the patient's history were reviewed and updated as appropriate: allergies, current medications, past family history, past medical history, past social history, past surgical history and problem list.   Objective:   Vitals:   11/27/18 0825  BP: 106/71  Pulse: 84  Weight: 138 lb 14.4 oz (63 kg)    Fetal Status: Fetal Heart Rate (bpm): 144   Movement: Present     General:  Alert, oriented and cooperative. Patient is in no acute distress.  Skin: Skin is warm and dry. No rash noted.   Cardiovascular: Normal heart rate noted  Respiratory: Normal respiratory effort, no problems with respiration noted  Abdomen: Soft, gravid, appropriate for gestational age.  Pain/Pressure: Absent     Pelvic: Cervical exam deferred        Extremities: Normal range of motion.  Edema: None  Mental Status: Normal mood and affect. Normal behavior. Normal judgment and thought content.   Assessment and Plan:  Pregnancy: G2P1001 at [redacted]w[redacted]d 1. Supervision of other normal pregnancy, antepartum Routine testing - Glucose Tolerance, 2 Hours w/1 Hour - RPR - HIV antibody (with reflex) - CBC  2. Pregnancy affected by fetal growth restriction F/u dopplers and growth  Preterm labor symptoms and general obstetric precautions including but not limited to vaginal bleeding, contractions, leaking of fluid and fetal movement were reviewed in detail with  the patient. Please refer to After Visit Summary for other counseling recommendations.   Return in about 2 weeks (around 12/11/2018).  Future Appointments  Date Time Provider [place]  11/29/2018  3:15 PM [place] [facility]  11/29/2018  3:15 PM [place] NURSE [place] [facility]  11/29/2018  3:15 PM [place] [facility]    Emeterio Reeve, MD

## 2018-11-27 NOTE — Patient Instructions (Signed)

## 2018-11-28 LAB — CBC
Hematocrit: 33.7 % — ABNORMAL LOW (ref 34.0–46.6)
Hemoglobin: 11.4 g/dL (ref 11.1–15.9)
MCH: 29.2 pg (ref 26.6–33.0)
MCHC: 33.8 g/dL (ref 31.5–35.7)
MCV: 86 fL (ref 79–97)
Platelets: 257 10*3/uL (ref 150–450)
RBC: 3.9 x10E6/uL (ref 3.77–5.28)
RDW: 13.5 % (ref 11.7–15.4)
WBC: 6 10*3/uL (ref 3.4–10.8)

## 2018-11-28 LAB — GLUCOSE TOLERANCE, 2 HOURS W/ 1HR
Glucose, 1 hour: 136 mg/dL (ref 65–179)
Glucose, 2 hour: 108 mg/dL (ref 65–152)
Glucose, Fasting: 79 mg/dL (ref 65–91)

## 2018-11-28 LAB — HIV ANTIBODY (ROUTINE TESTING W REFLEX): HIV Screen 4th Generation wRfx: NONREACTIVE

## 2018-11-28 LAB — RPR: RPR Ser Ql: NONREACTIVE

## 2018-11-29 ENCOUNTER — Encounter (HOSPITAL_COMMUNITY): Payer: Self-pay

## 2018-11-29 ENCOUNTER — Ambulatory Visit (HOSPITAL_BASED_OUTPATIENT_CLINIC_OR_DEPARTMENT_OTHER)
Admission: RE | Admit: 2018-11-29 | Discharge: 2018-11-29 | Disposition: A | Discharge status: Home / Self Care | Payer: Medicaid Other | Source: Ambulatory Visit | Attending: Obstetrics and Gynecology | Admitting: Obstetrics and Gynecology

## 2018-11-29 ENCOUNTER — Ambulatory Visit (HOSPITAL_COMMUNITY): Payer: Medicaid Other | Admitting: *Deleted

## 2018-11-29 ENCOUNTER — Ambulatory Visit (HOSPITAL_COMMUNITY)
Admission: RE | Admit: 2018-11-29 | Discharge: 2018-11-29 | Disposition: A | Discharge status: Home / Self Care | Payer: Medicaid Other | Source: Ambulatory Visit | Attending: Obstetrics and Gynecology | Admitting: Obstetrics and Gynecology

## 2018-11-29 ENCOUNTER — Ambulatory Visit (HOSPITAL_COMMUNITY): Payer: Medicaid Other

## 2018-11-29 ENCOUNTER — Other Ambulatory Visit: Payer: Self-pay

## 2018-11-29 DIAGNOSIS — Z3480 Encounter for supervision of other normal pregnancy, unspecified trimester: Secondary | ICD-10-CM

## 2018-11-29 DIAGNOSIS — O365990 Maternal care for other known or suspected poor fetal growth, unspecified trimester, not applicable or unspecified: Secondary | ICD-10-CM | POA: Diagnosis not present

## 2018-11-29 DIAGNOSIS — O09293 Supervision of pregnancy with other poor reproductive or obstetric history, third trimester: Secondary | ICD-10-CM | POA: Diagnosis not present

## 2018-11-29 DIAGNOSIS — Z3A29 29 weeks gestation of pregnancy: Secondary | ICD-10-CM | POA: Diagnosis not present

## 2018-11-29 DIAGNOSIS — O0933 Supervision of pregnancy with insufficient antenatal care, third trimester: Secondary | ICD-10-CM

## 2018-11-29 DIAGNOSIS — O403XX0 Polyhydramnios, third trimester, not applicable or unspecified: Secondary | ICD-10-CM | POA: Diagnosis not present

## 2018-11-29 DIAGNOSIS — O359XX0 Maternal care for (suspected) fetal abnormality and damage, unspecified, not applicable or unspecified: Secondary | ICD-10-CM

## 2018-11-29 DIAGNOSIS — Z362 Encounter for other antenatal screening follow-up: Secondary | ICD-10-CM | POA: Diagnosis not present

## 2018-11-29 DIAGNOSIS — O365930 Maternal care for other known or suspected poor fetal growth, third trimester, not applicable or unspecified: Secondary | ICD-10-CM

## 2018-11-29 NOTE — Procedures (Signed)
Kathryn Barnes 07-25-1985 [redacted]w[redacted]d  Fetus A Non-Stress Test Interpretation for 11/29/18  Indication: IUGR  Fetal Heart Rate A Mode: External Baseline Rate (A): 145 bpm Variability: Moderate Accelerations: 10 x 10 Decelerations: Variable Multiple birth?: No  Uterine Activity Mode: Toco Contraction Frequency (min): none noted  Interpretation (Fetal Testing) Nonstress Test Interpretation: Reactive Comments: FHR tracing rev'd by Dr. Donalee Citrin

## 2018-12-02 ENCOUNTER — Other Ambulatory Visit (HOSPITAL_COMMUNITY): Payer: Self-pay | Admitting: *Deleted

## 2018-12-02 DIAGNOSIS — O365930 Maternal care for other known or suspected poor fetal growth, third trimester, not applicable or unspecified: Secondary | ICD-10-CM

## 2018-12-03 ENCOUNTER — Encounter (HOSPITAL_COMMUNITY): Payer: Self-pay

## 2018-12-03 ENCOUNTER — Other Ambulatory Visit: Payer: Self-pay

## 2018-12-03 ENCOUNTER — Ambulatory Visit (HOSPITAL_COMMUNITY): Payer: Medicaid Other | Admitting: *Deleted

## 2018-12-03 ENCOUNTER — Ambulatory Visit (HOSPITAL_COMMUNITY)
Admission: RE | Admit: 2018-12-03 | Discharge: 2018-12-03 | Disposition: A | Discharge status: Home / Self Care | Payer: Medicaid Other | Source: Ambulatory Visit | Attending: Obstetrics and Gynecology | Admitting: Obstetrics and Gynecology

## 2018-12-03 DIAGNOSIS — O0933 Supervision of pregnancy with insufficient antenatal care, third trimester: Secondary | ICD-10-CM | POA: Diagnosis not present

## 2018-12-03 DIAGNOSIS — Z3480 Encounter for supervision of other normal pregnancy, unspecified trimester: Secondary | ICD-10-CM

## 2018-12-03 DIAGNOSIS — O359XX0 Maternal care for (suspected) fetal abnormality and damage, unspecified, not applicable or unspecified: Secondary | ICD-10-CM

## 2018-12-03 DIAGNOSIS — O365931 Maternal care for other known or suspected poor fetal growth, third trimester, fetus 1: Secondary | ICD-10-CM | POA: Diagnosis not present

## 2018-12-03 DIAGNOSIS — Z3A29 29 weeks gestation of pregnancy: Secondary | ICD-10-CM

## 2018-12-03 DIAGNOSIS — O09293 Supervision of pregnancy with other poor reproductive or obstetric history, third trimester: Secondary | ICD-10-CM | POA: Diagnosis not present

## 2018-12-03 DIAGNOSIS — O365930 Maternal care for other known or suspected poor fetal growth, third trimester, not applicable or unspecified: Secondary | ICD-10-CM | POA: Insufficient documentation

## 2018-12-03 DIAGNOSIS — O403XX0 Polyhydramnios, third trimester, not applicable or unspecified: Secondary | ICD-10-CM

## 2018-12-03 NOTE — Progress Notes (Signed)
Pt reports decreased fetal movement since her last appointment. Discussed fetal kick count.

## 2018-12-11 ENCOUNTER — Telehealth (INDEPENDENT_AMBULATORY_CARE_PROVIDER_SITE_OTHER): Payer: Medicaid Other | Admitting: Obstetrics and Gynecology

## 2018-12-11 ENCOUNTER — Encounter: Payer: Self-pay | Admitting: Obstetrics and Gynecology

## 2018-12-11 VITALS — BP 104/64 | HR 90

## 2018-12-11 DIAGNOSIS — O09293 Supervision of pregnancy with other poor reproductive or obstetric history, third trimester: Secondary | ICD-10-CM

## 2018-12-11 DIAGNOSIS — Z3A30 30 weeks gestation of pregnancy: Secondary | ICD-10-CM

## 2018-12-11 DIAGNOSIS — O365990 Maternal care for other known or suspected poor fetal growth, unspecified trimester, not applicable or unspecified: Secondary | ICD-10-CM

## 2018-12-11 DIAGNOSIS — O09299 Supervision of pregnancy with other poor reproductive or obstetric history, unspecified trimester: Secondary | ICD-10-CM

## 2018-12-11 DIAGNOSIS — O35BXX0 Maternal care for other (suspected) fetal abnormality and damage, fetal cardiac anomalies, not applicable or unspecified: Secondary | ICD-10-CM

## 2018-12-11 DIAGNOSIS — Z3480 Encounter for supervision of other normal pregnancy, unspecified trimester: Secondary | ICD-10-CM

## 2018-12-11 DIAGNOSIS — O365930 Maternal care for other known or suspected poor fetal growth, third trimester, not applicable or unspecified: Secondary | ICD-10-CM

## 2018-12-11 DIAGNOSIS — O358XX0 Maternal care for other (suspected) fetal abnormality and damage, not applicable or unspecified: Secondary | ICD-10-CM

## 2018-12-11 NOTE — Progress Notes (Signed)
TELEHEALTH OBSTETRICS PRENATAL VIRTUAL VIDEO VISIT ENCOUNTER NOTE  Provider location: [facility] [employer] [facility] at [place]   I connected with [facility] on 12/11/18 at  2:45 PM EST by MyChart Video Encounter at home and verified that I am speaking with the correct person using two identifiers.   I discussed the limitations, risks, security and privacy concerns of performing an evaluation and management service virtually and the availability of in person appointments. I also discussed with the patient that there may be a patient responsible charge related to this service. The patient expressed understanding and agreed to proceed. Subjective:  Kathryn Barnes is a 33 y.o. G2P1001 at [redacted]w[redacted]d being seen today for ongoing prenatal care.  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  Patient reports no complaints.  Contractions: Not present. Vag. Bleeding: None.  Movement: Present. Denies any leaking of fluid.   The following portions of the patient's history were reviewed and updated as appropriate: allergies, current medications, past family history, past medical history, past social history, past surgical history and problem list.   Objective:   Vitals:   12/11/18 1448  BP: 104/64  Pulse: 90    Fetal Status:     Movement: Present     General:  Alert, oriented and cooperative. Patient is in no acute distress.  Respiratory: Normal respiratory effort, no problems with respiration noted  Mental Status: Normal mood and affect. Normal behavior. Normal judgment and thought content.  Rest of physical exam deferred due to type of  encounter  Imaging: [place] Mfm Ob Follow Up  Result Date: 11/29/2018 ----------------------------------------------------------------------  OBSTETRICS REPORT                       (Signed Final 11/29/2018 05:18 pm) ---------------------------------------------------------------------- Patient Info  ID #:       [id number]                          D.O.B.:  Nov 11, 1985 (32 yrs)  Name:       Kathryn Barnes               Visit Date: 11/29/2018 04:32 pm ---------------------------------------------------------------------- Performed By  Performed By:     Rodrigo Ran BS      Ref. Address:     [address]  [place]                                                             [address]  Attending:        Tama High MD        [facility]  Referred By:      [place] ---------------------------------------------------------------------- Orders   #  Description                          Code         Ordered By   1  [place] MFM OB FOLLOW UP                  76816.01     [facility]   2  [place] MFM UA CORD DOPPLER               76820.02     RAVI [facility]  ----------------------------------------------------------------------   #  Order #                    Accession #                 Episode #   1  [id number]                  [id number]   2  [id number]                  [id number]  ---------------------------------------------------------------------- Indications   Encounter for other antenatal screening        Z36.2   follow-up   Poor obstetric history: Previous               O09.299   preeclampsia / eclampsia/gestational HTN   (ASA)   Late to prenatal care, third  trimester         O09.33   Fetal abnormality - other known or             O35.9XX0   suspected (fetal heart defect)   Maternal care for known or suspected poor      O36.5930   fetal growth, third trimester, not applicable or   unspecified IUGR   Polyhydramnios, third trimester, antepartum    O40.3XX0   condition or complication, unspecified fetus   [redacted] weeks gestation of pregnancy                Z3A.29  ---------------------------------------------------------------------- Vital Signs  Height:        5'4" ---------------------------------------------------------------------- Fetal Evaluation  Num Of Fetuses:         1  Fetal Heart Rate(bpm):  137  Cardiac Activity:       Observed  Presentation:           Breech  Placenta:               Anterior  P. Cord Insertion:      Visualized  Amniotic Fluid  AFI FV:      Polyhydramnios  AFI Sum(cm)     %Tile       Largest Pocket(cm)  30.79           > 97        9.03  RUQ(cm)       RLQ(cm)       LUQ(cm)        LLQ(cm)  7.05          5.88          9.03           8.83 ---------------------------------------------------------------------- Biometry  BPD:      70.2  mm     G. Age:  28w 1d         16  %    CI:        74.88   %    70 - 86                                                          FL/HC:      17.6   %    19.6 - 20.8  HC:      257.4  mm     G. Age:  28w 0d          4  %    HC/AC:      1.20        0.99 - 1.21  AC:      215.1  mm     G. Age:  26w 0d        < 1  %    FL/BPD:     64.4   %    71 - 87  FL:       45.2  mm     G. Age:  25w 0d        < 1  %    FL/AC:      21.0   %    20 - 24  HUM:      39.9  mm     G. Age:  24w 2d        < 5  %  Est. FW:     870  gm    1 lb 15 oz     < 1  % ---------------------------------------------------------------------- OB History  Gravidity:    2         Term:   1        Prem:   0        SAB:   0  TOP:          0       Ectopic:  0        Living: 1  ---------------------------------------------------------------------- Gestational Age  LMP:  29w 0d        Date:  05/10/18                 EDD:   02/14/19  U/S Today:     26w 6d                                        EDD:   03/01/19  Best:          29w 0d     Det. By:  LMP  (05/10/18)          EDD:   02/14/19 ---------------------------------------------------------------------- Anatomy  Cranium:               Appears normal         Aortic Arch:            Not well visualized  Cavum:                 Appears normal         Ductal Arch:            Not well visualized  Ventricles:            Appears normal         Diaphragm:              Previously seen  Choroid Plexus:        Appears normal         Stomach:                Appears normal, left                                                                        sided  Cerebellum:            Abnormal, see          Abdomen:                Appears normal                         comments  Nuchal Fold:           Not applicable (Q000111Q    Abdominal Wall:         Previously seen                         wks GA)  Face:                  Absent nasal bone      Cord Vessels:           Previously seen                         prev vis  Lips:                  Appears normal         Kidneys:                Appear normal  Palate:  Not well visualized    Bladder:                Appears normal  Thoracic:              Appears normal         Spine:                  Limited views prev                                                                        visualized  Heart:                 Abnormal, see          Upper Extremities:      Previously seen                         comments  RVOT:                  Not well visualized    Lower Extremities:      Previously seen  LVOT:                  Not well visualized  Other:  Technically difficult due to fetal position. ---------------------------------------------------------------------- Doppler - Fetal Vessels  Umbilical  Artery   S/D     %tile                                            ADFV    RDFV  3.81       88                                                No      No ---------------------------------------------------------------------- Cervix Uterus Adnexa  Cervix  Not visualized (advanced GA >24wks)  Uterus  No abnormality visualized.  Left Ovary  Not visualized.  Right Ovary  Not visualized.  Cul De Sac  No free fluid seen.  Adnexa  No abnormality visualized. 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  Fetal growth restriction.  She had been counseled extensively earlier and the patient  opted not to have cell free fetal DNA screening or  amniocentesis.  She also opted not to have fetal brain MRI.  Fetal echocardiography confirmed complete AV canal defect.  She has a follow-up fetal echo appointment next month.  On today's ultrasound, polyhydramnios is seen (AFI=31 cm).  Careful evaluation of abdominal cavity did not show double  bubble appearance.  Complete AV canal defect is seen.  Posterior fossa cyst is also seen.  No other anomalies are  seen.  The estimated fetal weight is at less than 1st percentile.  Interval weight gain is 292 g (suboptimal).  Umbilical artery  Doppler showed normal forward diastolic flow.  NST is  reactive for this gestational age.  I explained  the finding of polyhydramnios that is related to  fetal anomalies.  We will continue to evaluate fetal abdomen  to rule out duodenal atresia.  I discussed the limitations of antenatal testing in predicting  fetal compromise.  In the presence of fetal anomaly, the risk  of stillbirth is high. ---------------------------------------------------------------------- Recommendations  -Continue weekly Doppler and NST.  -Fetal growth in 3 weeks.  -BPP to be added from 32 weeks.  ----------------------------------------------------------------------                  Tama High, MD Electronically Signed Final Report   11/29/2018 05:18 pm ----------------------------------------------------------------------  [place] Mfm Ua Cord Doppler  Result Date: 12/03/2018 ----------------------------------------------------------------------  OBSTETRICS REPORT                       (Signed Final 12/03/2018 09:23 am) ---------------------------------------------------------------------- Patient Info  ID #:       [id number]                          D.O.B.:  07-Jul-1985 (32 yrs)  Name:       Kathryn Barnes               Visit Date: 12/03/2018 07:53 am ---------------------------------------------------------------------- Performed By  Performed By:     Jacob Moores BS,       Ref. Address:     [address] [place]                                                             [address]                                                             [place]                                                             [place]  Attending:        Johnell Comings MD         [facility]  Referred By:      [place] ---------------------------------------------------------------------- Orders   #  Description                          Code         Ordered By   1  [place] MFM UA CORD DOPPLER               76820.02     [facility]  ----------------------------------------------------------------------   #  Order #                    Accession #                 Episode #   1  [id number]                  [id number]  ---------------------------------------------------------------------- Indications   Maternal care for known or suspected poor      O36.5931   fetal growth, third trimester, fetus 1 IUGR   Fetal abnormality -  other known or             O35.9XX0   suspected (fetal heart defect)   Polyhydramnios, third trimester, antepartum    O40.3XX0   condition or complication, unspecified fetus   Poor obstetric history: Previous               O09.299   preeclampsia / eclampsia/gestational HTN   (ASA)   Late to prenatal care, third trimester         O09.33   [redacted] weeks gestation of pregnancy                Z3A.29  ---------------------------------------------------------------------- Vital Signs                                                 Height:        5'4" ---------------------------------------------------------------------- Fetal Evaluation  Num Of Fetuses:         1  Fetal Heart Rate(bpm):  144  Cardiac Activity:       Observed  Presentation:           Breech  Placenta:               Anterior  P. Cord Insertion:      Visualized  Amniotic Fluid  AFI FV:      Polyhydramnios  AFI Sum(cm)     %Tile       Largest Pocket(cm)  24.99           96          7.47  RUQ(cm)       RLQ(cm)       LUQ(cm)        LLQ(cm)  3.98          7.47          6.52           7.02 ---------------------------------------------------------------------- Biometry  LV:        3.4  mm ---------------------------------------------------------------------- OB History  Gravidity:    2         Term:   1        Prem:  0        SAB:   0  TOP:          0       Ectopic:  0        Living: 1 ---------------------------------------------------------------------- Gestational Age  LMP:           29w 4d        Date:  05/10/18                 EDD:   02/14/19  Best:          29w 4d     Det. By:  LMP  (05/10/18)          EDD:   02/14/19 ---------------------------------------------------------------------- Doppler - Fetal Vessels  Umbilical Artery   S/D     %tile     RI              PI                     ADFV    RDFV  2.92       53   0.66             1.01                        No      No ---------------------------------------------------------------------- Cervix Uterus Adnexa   Cervix  Not visualized (advanced GA >24wks)  Uterus  No abnormality visualized.  Left Ovary  Within normal limits. No adnexal mass visualized.  Right Ovary  Within normal limits. No adnexal mass visualized.  Cul De Sac  No free fluid seen.  Adnexa  No abnormality visualized. ---------------------------------------------------------------------- Comments  This patient was seen due to an IUGR fetus.  The fetus has a  confirmed AV canal defect and a cystic structure noted in the  posterior fossa.  She denies any problems since her last  exam.  Polyhydramnios continues to be noted on today ultrasound.  A normal-appearing stomach bubble was visualized today.  The AV canal defect is noted again today.  The cystic structure in the posterior fossa continues to be  noted.  The patient was advised that the cystic structure in  the posterior fossa may be a normal variant or it may  represent a Blake's pouch cyst.  Doppler studies of the umbilical arteries performed due to  fetal growth restriction showed a normal S/D ratio of 2.92.  Vigorous fetal movements were noted throughout today's  ultrasound exam.  A follow-up exam was scheduled in 1 week. ----------------------------------------------------------------------                   Johnell Comings, MD Electronically Signed Final Report   12/03/2018 09:23 am ----------------------------------------------------------------------  [place] Mfm Ua Cord Doppler  Result Date: 11/29/2018 ----------------------------------------------------------------------  OBSTETRICS REPORT                       (Signed Final 11/29/2018 05:18 pm) ---------------------------------------------------------------------- Patient Info  ID #:       [id number]                          D.O.B.:  05-Jun-1985 (32 yrs)  Name:       Kathryn Barnes               Visit Date: 11/29/2018 04:32 pm ---------------------------------------------------------------------- Performed By  Performed By:  Carrie Stalter BS       Ref. Address:     [place]                                                             [address]                                                             [address] [place]                                                             [place]  Attending:        Tama High MD        [facility]  Referred By:      [facility] ---------------------------------------------------------------------- Orders   #  Description                          Code         Ordered By   1  [place] MFM OB FOLLOW UP                  [id number]     [facility]   2  [place] MFM UA CORD DOPPLER               [id number]     [facility]  ----------------------------------------------------------------------   #  Order #                    Accession #                 Episode #   1  [id number]                  [id number]   2  [id number]  [id number]                  [id number]  ---------------------------------------------------------------------- Indications   Encounter for other antenatal screening        Z36.2   follow-up   Poor obstetric history: Previous               O09.299   preeclampsia / eclampsia/gestational HTN   (ASA)   Late to prenatal care, third trimester         O09.33   Fetal abnormality - other known or             O35.9XX0   suspected (fetal heart defect)   Maternal care for known or suspected poor      O36.5930   fetal growth, third trimester, not applicable or   unspecified IUGR   Polyhydramnios, third trimester, antepartum    O40.3XX0   condition or complication, unspecified fetus   [redacted] weeks gestation of pregnancy                Z3A.29  ---------------------------------------------------------------------- Vital Signs                                                 Height:        5'4"  ---------------------------------------------------------------------- Fetal Evaluation  Num Of Fetuses:         1  Fetal Heart Rate(bpm):  137  Cardiac Activity:       Observed  Presentation:           Breech  Placenta:               Anterior  P. Cord Insertion:      Visualized  Amniotic Fluid  AFI FV:      Polyhydramnios  AFI Sum(cm)     %Tile       Largest Pocket(cm)  30.79           > 97        9.03  RUQ(cm)       RLQ(cm)       LUQ(cm)        LLQ(cm)  7.05          5.88          9.03           8.83 ---------------------------------------------------------------------- Biometry  BPD:      70.2  mm     G. Age:  28w 1d         16  %    CI:        74.88   %    70 - 86                                                          FL/HC:      17.6   %    19.6 - 20.8  HC:      257.4  mm     G. Age:  28w 0d          4  %    HC/AC:      1.20        0.99 - 1.21  AC:      215.1  mm     G. Age:  26w 0d        < 1  %    FL/BPD:     64.4   %    71 - 87  FL:       45.2  mm     G. Age:  25w 0d        < 1  %    FL/AC:      21.0   %    20 - 24  HUM:      39.9  mm     G. Age:  24w 2d        < 5  %  Est. FW:     870  gm    1 lb 15 oz     < 1  % ---------------------------------------------------------------------- OB History  Gravidity:    2         Term:   1        Prem:   0        SAB:   0  TOP:          0       Ectopic:  0        Living: 1 ---------------------------------------------------------------------- Gestational Age  LMP:           29w 0d        Date:  05/10/18                 EDD:   02/14/19  U/S Today:     26w 6d                                        EDD:   03/01/19  Best:          29w 0d     Det. By:  LMP  (05/10/18)          EDD:   02/14/19 ---------------------------------------------------------------------- Anatomy  Cranium:               Appears normal         Aortic Arch:            Not well visualized  Cavum:                 Appears normal         Ductal Arch:            Not well visualized  Ventricles:             Appears normal         Diaphragm:              Previously seen  Choroid Plexus:        Appears normal         Stomach:                Appears normal, left                                                                        sided  Cerebellum:            Abnormal, see          Abdomen:                Appears normal                         comments  Nuchal Fold:           Not applicable (Q000111Q    Abdominal Wall:         Previously seen                         wks GA)  Face:                  Absent nasal bone      Cord Vessels:           Previously seen                         prev vis  Lips:                  Appears normal         Kidneys:                Appear normal  Palate:                Not well visualized    Bladder:                Appears normal  Thoracic:              Appears normal         Spine:                  Limited views prev                                                                        visualized  Heart:                 Abnormal, see          Upper Extremities:      Previously seen                         comments  RVOT:                  Not well visualized    Lower Extremities:      Previously seen  LVOT:                  Not well visualized  Other:  Technically difficult due to fetal position. ---------------------------------------------------------------------- Doppler - Fetal Vessels  Umbilical Artery   S/D     %tile                                            ADFV    RDFV  3.81       88  No      No ---------------------------------------------------------------------- Cervix Uterus Adnexa  Cervix  Not visualized (advanced GA >24wks)  Uterus  No abnormality visualized.  Left Ovary  Not visualized.  Right Ovary  Not visualized.  Cul De Sac  No free fluid seen.  Adnexa  No abnormality visualized. 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  Posterior fossa  malformation (suspected Blake's pouch cyst).  Fetal growth restriction.  She had been counseled extensively earlier and the patient  opted not to have cell free fetal DNA screening or  amniocentesis.  She also opted not to have fetal brain MRI.  Fetal echocardiography confirmed complete AV canal defect.  She has a follow-up fetal echo appointment next month.  On today's ultrasound, polyhydramnios is seen (AFI=31 cm).  Careful evaluation of abdominal cavity did not show double  bubble appearance.  Complete AV canal defect is seen.  Posterior fossa cyst is also seen.  No other anomalies are  seen.  The estimated fetal weight is at less than 1st percentile.  Interval weight gain is 292 g (suboptimal).  Umbilical artery  Doppler showed normal forward diastolic flow.  NST is  reactive for this gestational age.  I explained the finding of polyhydramnios that is related to  fetal anomalies.  We will continue to evaluate fetal abdomen  to rule out duodenal atresia.  I discussed the limitations of antenatal testing in predicting  fetal compromise.  In the presence of fetal anomaly, the risk  of stillbirth is high. ---------------------------------------------------------------------- Recommendations  -Continue weekly Doppler and NST.  -Fetal growth in 3 weeks.  -BPP to be added from 32 weeks. ----------------------------------------------------------------------                  Tama High, MD Electronically Signed Final Report   11/29/2018 05:18 pm ----------------------------------------------------------------------  [place] Mfm Ua Cord Doppler  Result Date: 11/22/2018 ----------------------------------------------------------------------  OBSTETRICS REPORT                       (Signed Final 11/22/2018 04:48 pm) ---------------------------------------------------------------------- Patient Info  ID #:       [id number]                          D.O.B.:  05/13/1985 (32 yrs)  Name:       Kathryn Barnes               Visit  Date: 11/22/2018 03:13 pm ---------------------------------------------------------------------- Performed By  Performed By:     Vianne Bulls Tester BS,       Ref. Address:     [address], [place]  [place]  Attending:        Tama High MD        Location[facility]  Referred By:      [place] ---------------------------------------------------------------------- Orders   #  Description                          Code         Ordered By   1  [place] MFM UA CORD DOPPLER               76820.02     [facility]  ----------------------------------------------------------------------   #  Order #                    Accession #                 Episode #   1  [id number]                  [id number]                  [id number]  ---------------------------------------------------------------------- Indications   Poor obstetric history: Previous               O09.299   preeclampsia / eclampsia/gestational HTN   (ASA)   Late to prenatal care, third trimester         O09.33   Fetal abnormality - other known or             O35.9XX0   suspected (fetal heart defect)   Encounter for fetal growth retardation         Z36.4   [redacted] weeks gestation of pregnancy                Z3A.28  ---------------------------------------------------------------------- Vital Signs                                                 Height:        5'4" ---------------------------------------------------------------------- Fetal Evaluation  Num Of Fetuses:         1  Fetal Heart Rate(bpm):  154  Cardiac Activity:       Observed  Presentation:  Cephalic  Placenta:               Anterior  P.  Cord Insertion:      Visualized, central  Amniotic Fluid  AFI FV:      Subjectively upper-normal                              Largest Pocket(cm)                              8.1 ---------------------------------------------------------------------- OB History  Gravidity:    2         Term:   1        Prem:   0        SAB:   0  TOP:          0       Ectopic:  0        Living: 1 ---------------------------------------------------------------------- Gestational Age  LMP:           28w 0d        Date:  05/10/18                 EDD:   02/14/19  Best:          Timothy Lasso 0d     Det. By:  LMP  (05/10/18)          EDD:   02/14/19 ---------------------------------------------------------------------- Anatomy  Cranium:               Appears normal         Aortic Arch:            Not well visualized  Cavum:                 Appears normal         Ductal Arch:            Not well visualized  Ventricles:            Appears normal         Diaphragm:              Appears normal  Choroid Plexus:        Appears normal         Stomach:                Appears normal, left                                                                        sided  Cerebellum:            Abnormal, see          Abdomen:                Appears normal                         comments  Nuchal Fold:           Not applicable ([id number]    Abdominal Wall:         Appears nml (cord  wks GA)                                        insert, abd wall)  Face:                  Absent nasal bone      Cord Vessels:           Appears normal (3                                                                        vessel cord)  Lips:                  Appears normal         Kidneys:                Appear normal  Palate:                Not well visualized    Bladder:                Appears normal  Thoracic:              Appears normal         Spine:                  Limited views                                                                        appear normal   Heart:                 Abnormal, see          Upper Extremities:      Previously seen                         comments  RVOT:                  Not well visualized    Lower Extremities:      Previously seen  LVOT:                  Not well visualized  Other:  Female gender previously seen. Technically difficult due to fetal          position. ---------------------------------------------------------------------- Doppler - Fetal Vessels  Umbilical Artery   S/D     %tile     RI              PI  3.19       59   0.69             1.17 ---------------------------------------------------------------------- Cervix Uterus Adnexa  Cervix  Not visualized (advanced GA >24wks)  Uterus  No abnormality visualized.  Left Ovary  No adnexal mass visualized.  Right Ovary  No adnexal mass  visualized.  Cul De Sac  No free fluid seen.  Adnexa  No abnormality visualized. 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  Fetal growth restriction.  Amniotic fluid is in the upper limit of normal.  Umbilical artery  Doppler study showed normal forward diastolic flow.  Complete AV canal defect is seen.  Posterior fossa  malformation is seen again.  Her partner was present today and I counseled him on the  following:  -Discussed the option of amniocentesis for fetal karyotype  and MicroArray analysis.  -Discussed fetal brain MRI at [facility] to assess fetal posterior  fossa.  If abnormalities are found she can have a consultation  with pediatric neurosurgeon.  -If patient opts not to have amniocentesis or MRI, she can be  followed at our center and deliver at [place].  Patient and her partner took time to decide and opted not to  have amniocentesis or fetal brain MRI. ---------------------------------------------------------------------- Recommendations  -Fetal growth, NST and UA Doppler next week.  ----------------------------------------------------------------------                  Tama High, MD Electronically Signed Final Report   11/22/2018 04:48 pm ----------------------------------------------------------------------  [place] Mfm Ua Cord Doppler  Result Date: 11/19/2018 ----------------------------------------------------------------------  OBSTETRICS REPORT                    (Corrected Final 11/19/2018 01:01 pm) ---------------------------------------------------------------------- Patient Info  ID #:       [id number]                          D.O.B.:  05-05-1985 (32 yrs)  Name:       Kathryn Barnes               Visit Date: 11/15/2018 04:02 pm ---------------------------------------------------------------------- Performed By  Performed By:     Novella Rob        Ref. Address:     [place]  [place]  Attending:        Tama High MD        [facility]  Referred By:      [facility] ---------------------------------------------------------------------- Orders   #  Description                          Code         Ordered By   1  [place] MFM UA CORD DOPPLER               76820.02     [place]  ----------------------------------------------------------------------   #  Order #                    Accession #                 Episode #   1  [id number]                  [id number]                  [id number]  ---------------------------------------------------------------------- Indications   Poor obstetric history: Previous               O09.299   preeclampsia / eclampsia/gestational HTN   (ASA)   Late prenatal care,  second trimester           O09.32   Fetal abnormality - other known or             O35.9XX0   suspected (fetal heart defect)   [redacted] weeks gestation of pregnancy                Z3A.27  ---------------------------------------------------------------------- Vital Signs  Weight (lb): 132                               Height:        5'4"  BMI:         22.66 ---------------------------------------------------------------------- Fetal Evaluation  Num Of Fetuses:         1  Fetal Heart Rate(bpm):  142  Cardiac Activity:       Observed  Presentation:           Breech  Placenta:               Anterior  P. Cord Insertion:      Previously Visualized  Amniotic Fluid  AFI FV:      Polyhydramnios                              Largest Pocket(cm)                              9.02 ---------------------------------------------------------------------- OB History  Gravidity:    2         Term:   1        Prem:  0        SAB:   0  TOP:          0       Ectopic:  0        Living: 1 ---------------------------------------------------------------------- Gestational Age  LMP:           27w 0d        Date:  05/10/18                 EDD:   02/14/19  Best:          27w 0d     Det. By:  LMP  (05/10/18)          EDD:   02/14/19 ---------------------------------------------------------------------- Anatomy  Cerebellum:            Abnormal, see          Stomach:                Appears normal, left                         comments                                                                        sided  Face:                  Absent nasal bone      Bladder:                Appears normal  Heart:                 Abnormal, see                         comments ---------------------------------------------------------------------- Doppler - Fetal Vessels  Umbilical Artery   S/D     %tile  4.03       87 ---------------------------------------------------------------------- Cervix Uterus Adnexa  Cervix  Not visualized (advanced GA >24wks)  ---------------------------------------------------------------------- Impression  Ms. Collignon with fetal cardiac anomaly and fetal growth  restriction diagnosed on previous ultrasound return for  umbilical artery Doppler studies and NST.  She had fetal  echocardiography that confirmed complete AV canal defect  (Dr. Aida Puffer).  Patient opted not to have cell free fetal DNA screening or  amniocentesis.  On ultrasound, amniotic fluid is slightly increased.  Umbilical  artery Doppler study showed normal forward diastolic flow.  A  small posterior fossa cyst is seen having an hourglass  appearance.  Vermis seems to be pushed upward.  Lateral  ventricular measurements are normal and cerebellum  appears otherwise normal.  No other intracranial  abnormalities are seen.  NST is reactive for this gestational age.  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  It  is a persistence of an embryological structure and can be  associated with Down syndrome or other chromosomal  abnormalities.  If it is an isolated finding normal postnatal  development is expected. Differential diagnosis include  Dandy-Walker syndrome.  Based on this finding and the fetal cardiac anomaly, I have  recommended amniocentesis for fetal karyotype.  I also  discussed fetal brain MRI to confirm  the diagnosis.  Patient will bring her partner on her next visit and will make a  joint decision on amniocentesis and fetal MRI. ---------------------------------------------------------------------- Recommendations  -UA Doppler and NST next week.  -Fetal growth assessment in 2 weeks. ----------------------------------------------------------------------                       Tama High, MD Electronically Signed Corrected Final Report  11/19/2018 01:01 pm ----------------------------------------------------------------------   Assessment and Plan:  Pregnancy: G2P1001 at [redacted]w[redacted]d 1. Supervision of other normal pregnancy, antepartum Patient is doing  well without complaints  2. Fetal atrioventricular canal malformation during pregnancy, antepartum, single or unspecified fetus Fetal echo scheduled on 12/8  3. Hx of preeclampsia, prior pregnancy, currently pregnant Normotensive without symptoms  4. Pregnancy affected by fetal growth restriction Follow up ultrasound with doppler tomorrow  Preterm labor symptoms and general obstetric precautions including but not limited to vaginal bleeding, contractions, leaking of fluid and fetal movement were reviewed in detail with the patient. I discussed the assessment and treatment plan with the patient. The patient was provided an opportunity to ask questions and all were answered. The patient agreed with the plan and demonstrated an understanding of the instructions. The patient was advised to call back or seek an in-person office evaluation/go to MAU at [facility] for any urgent or concerning symptoms. Please refer to After Visit Summary for other counseling recommendations.   I provided 12 minutes of face-to-face time during this encounter.  No follow-ups on file.  Future Appointments  Date Time Provider [place]  12/12/2018  3:15 PM [place] MFC-US  12/12/2018  3:15 PM [place] 4 WH-MFCUS MFC-US  12/19/2018  3:30 PM [place] MFC-US  12/19/2018  3:30 PM [place] 1 WH-MFCUS MFC-US    Mora Bellman, MD [facility], [place]

## 2018-12-11 NOTE — Progress Notes (Signed)
S/w pt for virtual visit, pt reports fetal movement, denies pain. 

## 2018-12-12 ENCOUNTER — Ambulatory Visit (HOSPITAL_COMMUNITY): Payer: Medicaid Other | Admitting: *Deleted

## 2018-12-12 ENCOUNTER — Other Ambulatory Visit: Payer: Self-pay

## 2018-12-12 ENCOUNTER — Encounter (HOSPITAL_COMMUNITY): Payer: Self-pay

## 2018-12-12 ENCOUNTER — Ambulatory Visit (HOSPITAL_COMMUNITY)
Admission: RE | Admit: 2018-12-12 | Discharge: 2018-12-12 | Disposition: A | Discharge status: Home / Self Care | Payer: Medicaid Other | Source: Ambulatory Visit | Attending: Obstetrics and Gynecology | Admitting: Obstetrics and Gynecology

## 2018-12-12 DIAGNOSIS — O0933 Supervision of pregnancy with insufficient antenatal care, third trimester: Secondary | ICD-10-CM

## 2018-12-12 DIAGNOSIS — O365930 Maternal care for other known or suspected poor fetal growth, third trimester, not applicable or unspecified: Secondary | ICD-10-CM | POA: Diagnosis not present

## 2018-12-12 DIAGNOSIS — Z3A30 30 weeks gestation of pregnancy: Secondary | ICD-10-CM

## 2018-12-12 DIAGNOSIS — Z3480 Encounter for supervision of other normal pregnancy, unspecified trimester: Secondary | ICD-10-CM

## 2018-12-12 DIAGNOSIS — O365931 Maternal care for other known or suspected poor fetal growth, third trimester, fetus 1: Secondary | ICD-10-CM

## 2018-12-12 DIAGNOSIS — O359XX0 Maternal care for (suspected) fetal abnormality and damage, unspecified, not applicable or unspecified: Secondary | ICD-10-CM | POA: Diagnosis not present

## 2018-12-12 DIAGNOSIS — O403XX0 Polyhydramnios, third trimester, not applicable or unspecified: Secondary | ICD-10-CM | POA: Diagnosis not present

## 2018-12-13 ENCOUNTER — Other Ambulatory Visit (HOSPITAL_COMMUNITY): Payer: Self-pay | Admitting: *Deleted

## 2018-12-13 DIAGNOSIS — O365990 Maternal care for other known or suspected poor fetal growth, unspecified trimester, not applicable or unspecified: Secondary | ICD-10-CM

## 2018-12-17 ENCOUNTER — Encounter (HOSPITAL_COMMUNITY): Payer: Self-pay | Admitting: Obstetrics and Gynecology

## 2018-12-17 ENCOUNTER — Encounter: Payer: Self-pay | Admitting: *Deleted

## 2018-12-19 ENCOUNTER — Other Ambulatory Visit: Payer: Self-pay

## 2018-12-19 ENCOUNTER — Ambulatory Visit (HOSPITAL_COMMUNITY)
Admission: RE | Admit: 2018-12-19 | Discharge: 2018-12-19 | Disposition: A | Discharge status: Home / Self Care | Payer: Medicaid Other | Source: Ambulatory Visit | Attending: Obstetrics and Gynecology | Admitting: Obstetrics and Gynecology

## 2018-12-19 ENCOUNTER — Ambulatory Visit (HOSPITAL_COMMUNITY): Payer: Medicaid Other | Admitting: *Deleted

## 2018-12-19 ENCOUNTER — Encounter (HOSPITAL_COMMUNITY): Payer: Self-pay

## 2018-12-19 DIAGNOSIS — O365931 Maternal care for other known or suspected poor fetal growth, third trimester, fetus 1: Secondary | ICD-10-CM | POA: Diagnosis not present

## 2018-12-19 DIAGNOSIS — O09293 Supervision of pregnancy with other poor reproductive or obstetric history, third trimester: Secondary | ICD-10-CM | POA: Diagnosis not present

## 2018-12-19 DIAGNOSIS — Z3480 Encounter for supervision of other normal pregnancy, unspecified trimester: Secondary | ICD-10-CM | POA: Insufficient documentation

## 2018-12-19 DIAGNOSIS — O359XX0 Maternal care for (suspected) fetal abnormality and damage, unspecified, not applicable or unspecified: Secondary | ICD-10-CM | POA: Diagnosis not present

## 2018-12-19 DIAGNOSIS — O365930 Maternal care for other known or suspected poor fetal growth, third trimester, not applicable or unspecified: Secondary | ICD-10-CM | POA: Diagnosis not present

## 2018-12-19 DIAGNOSIS — Z362 Encounter for other antenatal screening follow-up: Secondary | ICD-10-CM | POA: Diagnosis not present

## 2018-12-19 DIAGNOSIS — O0933 Supervision of pregnancy with insufficient antenatal care, third trimester: Secondary | ICD-10-CM

## 2018-12-19 DIAGNOSIS — Z3A31 31 weeks gestation of pregnancy: Secondary | ICD-10-CM

## 2018-12-19 DIAGNOSIS — O403XX0 Polyhydramnios, third trimester, not applicable or unspecified: Secondary | ICD-10-CM

## 2018-12-25 ENCOUNTER — Encounter: Payer: Self-pay | Admitting: Obstetrics and Gynecology

## 2018-12-25 ENCOUNTER — Telehealth (INDEPENDENT_AMBULATORY_CARE_PROVIDER_SITE_OTHER): Payer: Medicaid Other | Admitting: Obstetrics and Gynecology

## 2018-12-25 VITALS — BP 107/66 | HR 91

## 2018-12-25 DIAGNOSIS — O09299 Supervision of pregnancy with other poor reproductive or obstetric history, unspecified trimester: Secondary | ICD-10-CM

## 2018-12-25 DIAGNOSIS — O365930 Maternal care for other known or suspected poor fetal growth, third trimester, not applicable or unspecified: Secondary | ICD-10-CM

## 2018-12-25 DIAGNOSIS — O365990 Maternal care for other known or suspected poor fetal growth, unspecified trimester, not applicable or unspecified: Secondary | ICD-10-CM

## 2018-12-25 DIAGNOSIS — Z3480 Encounter for supervision of other normal pregnancy, unspecified trimester: Secondary | ICD-10-CM

## 2018-12-25 DIAGNOSIS — O35BXX0 Maternal care for other (suspected) fetal abnormality and damage, fetal cardiac anomalies, not applicable or unspecified: Secondary | ICD-10-CM

## 2018-12-25 DIAGNOSIS — O09293 Supervision of pregnancy with other poor reproductive or obstetric history, third trimester: Secondary | ICD-10-CM

## 2018-12-25 DIAGNOSIS — O358XX0 Maternal care for other (suspected) fetal abnormality and damage, not applicable or unspecified: Secondary | ICD-10-CM

## 2018-12-25 NOTE — Progress Notes (Signed)
   TELEHEALTH OBSTETRICS PRENATAL VIRTUAL VIDEO VISIT ENCOUNTER NOTE  Provider location: [facility] [employer] [facility] at [place]   I connected with [facility] on 12/25/18 at  3:15 PM EST by MyChart Video Encounter at home and verified that I am speaking with the correct person using two identifiers.   I discussed the limitations, risks, security and privacy concerns of performing an evaluation and management service virtually and the availability of in person appointments. I also discussed with the patient that there may be a patient responsible charge related to this service. The patient expressed understanding and agreed to proceed. Subjective:  Kathryn Barnes is a 33 y.o. G2P1001 at [redacted]w[redacted]d being seen today for ongoing prenatal care.  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  Patient reports no complaints.  Contractions: Irritability. Vag. Bleeding: None.  Movement: Present. Denies any leaking of fluid.   The following portions of the patient's history were reviewed and updated as appropriate: allergies, current medications, past family history, past medical history, past social history, past surgical history and problem list.   Objective:   Vitals:   12/25/18 1437  BP: 107/66  Pulse: 91    Fetal Status:     Movement: Present     General:  Alert, oriented and cooperative. Patient is in no acute distress.  Respiratory: Normal respiratory effort, no problems with respiration noted  Mental Status: Normal mood and affect. Normal behavior. Normal judgment and thought content.  Rest of physical exam deferred due to type of  encounter  Imaging:   Assessment and Plan:  Pregnancy: G2P1001 at [redacted]w[redacted]d  1. Supervision of other normal pregnancy, antepartum Reviewed induction, possible early induction, procedures, risks/benefits, plans for vaginal delivery and why she may need CS (fetal indication, malpresentation)  2. Pregnancy affected by fetal growth restriction Last grwoth 2nd%tile Has weekly BPP/cord dopplers tomorrow  3. Hx of preeclampsia, prior pregnancy, currently pregnant BP stable Cont baby ASA  4. Fetal atrioventricular canal malformation during pregnancy, antepartum, single or unspecified fetus Suspected Down syndrome  Preterm labor symptoms and general obstetric precautions including but not limited to vaginal bleeding, contractions, leaking of fluid and fetal movement were reviewed in detail with the patient. I discussed the assessment and treatment plan with the patient. The patient was provided an opportunity to ask questions and all were answered. The patient agreed with the plan and demonstrated an understanding of the instructions. The patient was advised to call back or seek an in-person office evaluation/go to MAU at [facility] for any urgent or concerning symptoms. Please refer to After Visit Summary for other counseling recommendations.   I provided 20 minutes of face-to-face time during this encounter.  Return in about 2 weeks (around 01/08/2019) for high OB, virtual.  Future Appointments  Date Time Provider [place]  12/26/2018  2:45 PM [place] 5 WH-MFCUS MFC-US  12/26/2018  2:50 PM [place] MFC-US  01/01/2019  2:45 PM [place] 5 WH-MFCUS MFC-US  01/01/2019  2:55 PM [place] NURSE [place] MFC-US  01/07/2019  3:00 PM [place] 3 WH-MFCUS MFC-US  01/07/2019  3:10 PM [place] MFC-US    Sloan Leiter, [place] for [place], [place]

## 2018-12-25 NOTE — Progress Notes (Signed)
Pt is on the phone preparing for virtual visit with provider.  

## 2018-12-26 ENCOUNTER — Ambulatory Visit (HOSPITAL_COMMUNITY)
Admission: RE | Admit: 2018-12-26 | Discharge: 2018-12-26 | Disposition: A | Discharge status: Home / Self Care | Payer: Medicaid Other | Source: Ambulatory Visit | Attending: Obstetrics and Gynecology | Admitting: Obstetrics and Gynecology

## 2018-12-26 ENCOUNTER — Other Ambulatory Visit: Payer: Self-pay

## 2018-12-26 ENCOUNTER — Encounter (HOSPITAL_COMMUNITY): Payer: Self-pay

## 2018-12-26 ENCOUNTER — Ambulatory Visit (HOSPITAL_COMMUNITY): Payer: Medicaid Other | Admitting: *Deleted

## 2018-12-26 DIAGNOSIS — Z3480 Encounter for supervision of other normal pregnancy, unspecified trimester: Secondary | ICD-10-CM

## 2018-12-26 DIAGNOSIS — O359XX0 Maternal care for (suspected) fetal abnormality and damage, unspecified, not applicable or unspecified: Secondary | ICD-10-CM | POA: Diagnosis not present

## 2018-12-26 DIAGNOSIS — O365931 Maternal care for other known or suspected poor fetal growth, third trimester, fetus 1: Secondary | ICD-10-CM | POA: Diagnosis not present

## 2018-12-26 DIAGNOSIS — O09293 Supervision of pregnancy with other poor reproductive or obstetric history, third trimester: Secondary | ICD-10-CM

## 2018-12-26 DIAGNOSIS — O403XX0 Polyhydramnios, third trimester, not applicable or unspecified: Secondary | ICD-10-CM

## 2018-12-26 DIAGNOSIS — O365990 Maternal care for other known or suspected poor fetal growth, unspecified trimester, not applicable or unspecified: Secondary | ICD-10-CM | POA: Diagnosis not present

## 2018-12-26 DIAGNOSIS — O0933 Supervision of pregnancy with insufficient antenatal care, third trimester: Secondary | ICD-10-CM

## 2018-12-26 DIAGNOSIS — Z3A32 32 weeks gestation of pregnancy: Secondary | ICD-10-CM

## 2018-12-27 ENCOUNTER — Other Ambulatory Visit (HOSPITAL_COMMUNITY): Payer: Self-pay | Admitting: *Deleted

## 2018-12-27 DIAGNOSIS — Z364 Encounter for antenatal screening for fetal growth retardation: Secondary | ICD-10-CM

## 2019-01-01 ENCOUNTER — Other Ambulatory Visit: Payer: Self-pay

## 2019-01-01 ENCOUNTER — Ambulatory Visit (HOSPITAL_COMMUNITY): Payer: Medicaid Other | Admitting: *Deleted

## 2019-01-01 ENCOUNTER — Other Ambulatory Visit (HOSPITAL_COMMUNITY): Payer: Self-pay | Admitting: *Deleted

## 2019-01-01 ENCOUNTER — Encounter (HOSPITAL_COMMUNITY): Payer: Self-pay

## 2019-01-01 ENCOUNTER — Ambulatory Visit (HOSPITAL_COMMUNITY)
Admission: RE | Admit: 2019-01-01 | Discharge: 2019-01-01 | Disposition: A | Discharge status: Home / Self Care | Payer: Medicaid Other | Source: Ambulatory Visit | Attending: Obstetrics and Gynecology | Admitting: Obstetrics and Gynecology

## 2019-01-01 DIAGNOSIS — O09293 Supervision of pregnancy with other poor reproductive or obstetric history, third trimester: Secondary | ICD-10-CM

## 2019-01-01 DIAGNOSIS — O0933 Supervision of pregnancy with insufficient antenatal care, third trimester: Secondary | ICD-10-CM

## 2019-01-01 DIAGNOSIS — O403XX0 Polyhydramnios, third trimester, not applicable or unspecified: Secondary | ICD-10-CM | POA: Diagnosis not present

## 2019-01-01 DIAGNOSIS — Z3480 Encounter for supervision of other normal pregnancy, unspecified trimester: Secondary | ICD-10-CM | POA: Insufficient documentation

## 2019-01-01 DIAGNOSIS — O365990 Maternal care for other known or suspected poor fetal growth, unspecified trimester, not applicable or unspecified: Secondary | ICD-10-CM | POA: Insufficient documentation

## 2019-01-01 DIAGNOSIS — O365931 Maternal care for other known or suspected poor fetal growth, third trimester, fetus 1: Secondary | ICD-10-CM

## 2019-01-01 DIAGNOSIS — O359XX0 Maternal care for (suspected) fetal abnormality and damage, unspecified, not applicable or unspecified: Secondary | ICD-10-CM

## 2019-01-01 DIAGNOSIS — Z3A33 33 weeks gestation of pregnancy: Secondary | ICD-10-CM | POA: Diagnosis not present

## 2019-01-07 ENCOUNTER — Ambulatory Visit (HOSPITAL_COMMUNITY): Payer: Medicaid Other | Admitting: *Deleted

## 2019-01-07 ENCOUNTER — Other Ambulatory Visit: Payer: Self-pay

## 2019-01-07 ENCOUNTER — Encounter (HOSPITAL_COMMUNITY): Payer: Self-pay | Admitting: *Deleted

## 2019-01-07 ENCOUNTER — Ambulatory Visit (HOSPITAL_COMMUNITY)
Admission: RE | Admit: 2019-01-07 | Discharge: 2019-01-07 | Disposition: A | Discharge status: Home / Self Care | Payer: Medicaid Other | Source: Ambulatory Visit | Attending: Obstetrics and Gynecology | Admitting: Obstetrics and Gynecology

## 2019-01-07 DIAGNOSIS — Z3A34 34 weeks gestation of pregnancy: Secondary | ICD-10-CM

## 2019-01-07 DIAGNOSIS — O365990 Maternal care for other known or suspected poor fetal growth, unspecified trimester, not applicable or unspecified: Secondary | ICD-10-CM | POA: Diagnosis not present

## 2019-01-07 DIAGNOSIS — Z3480 Encounter for supervision of other normal pregnancy, unspecified trimester: Secondary | ICD-10-CM | POA: Insufficient documentation

## 2019-01-07 DIAGNOSIS — O403XX0 Polyhydramnios, third trimester, not applicable or unspecified: Secondary | ICD-10-CM | POA: Diagnosis not present

## 2019-01-07 DIAGNOSIS — Z362 Encounter for other antenatal screening follow-up: Secondary | ICD-10-CM | POA: Diagnosis not present

## 2019-01-07 DIAGNOSIS — O359XX0 Maternal care for (suspected) fetal abnormality and damage, unspecified, not applicable or unspecified: Secondary | ICD-10-CM | POA: Diagnosis not present

## 2019-01-07 DIAGNOSIS — O365931 Maternal care for other known or suspected poor fetal growth, third trimester, fetus 1: Secondary | ICD-10-CM

## 2019-01-07 DIAGNOSIS — O09293 Supervision of pregnancy with other poor reproductive or obstetric history, third trimester: Secondary | ICD-10-CM

## 2019-01-07 DIAGNOSIS — O0933 Supervision of pregnancy with insufficient antenatal care, third trimester: Secondary | ICD-10-CM | POA: Diagnosis not present

## 2019-01-08 ENCOUNTER — Telehealth (INDEPENDENT_AMBULATORY_CARE_PROVIDER_SITE_OTHER): Payer: Medicaid Other | Admitting: Obstetrics & Gynecology

## 2019-01-08 ENCOUNTER — Encounter: Payer: Self-pay | Admitting: Obstetrics & Gynecology

## 2019-01-08 DIAGNOSIS — O358XX0 Maternal care for other (suspected) fetal abnormality and damage, not applicable or unspecified: Secondary | ICD-10-CM

## 2019-01-08 DIAGNOSIS — O365990 Maternal care for other known or suspected poor fetal growth, unspecified trimester, not applicable or unspecified: Secondary | ICD-10-CM

## 2019-01-08 DIAGNOSIS — O35BXX0 Maternal care for other (suspected) fetal abnormality and damage, fetal cardiac anomalies, not applicable or unspecified: Secondary | ICD-10-CM

## 2019-01-08 DIAGNOSIS — O09293 Supervision of pregnancy with other poor reproductive or obstetric history, third trimester: Secondary | ICD-10-CM

## 2019-01-08 DIAGNOSIS — O09299 Supervision of pregnancy with other poor reproductive or obstetric history, unspecified trimester: Secondary | ICD-10-CM

## 2019-01-08 DIAGNOSIS — O365930 Maternal care for other known or suspected poor fetal growth, third trimester, not applicable or unspecified: Secondary | ICD-10-CM

## 2019-01-08 DIAGNOSIS — Z3A34 34 weeks gestation of pregnancy: Secondary | ICD-10-CM

## 2019-01-08 DIAGNOSIS — C07 Malignant neoplasm of parotid gland: Secondary | ICD-10-CM

## 2019-01-08 DIAGNOSIS — Z3480 Encounter for supervision of other normal pregnancy, unspecified trimester: Secondary | ICD-10-CM

## 2019-01-08 NOTE — Progress Notes (Signed)
TELEHEALTH OBSTETRICS PRENATAL VIRTUAL VIDEO VISIT ENCOUNTER NOTE  Provider location: [facility] [employer] [facility] at [place]   I connected with [facility] on 01/08/19 at  2:00 PM EST by MyChart Video Encounter at home and verified that I am speaking with the correct person using two identifiers.   I discussed the limitations, risks, security and privacy concerns of performing an evaluation and management service virtually and the availability of in person appointments. I also discussed with the patient that there may be a patient responsible charge related to this service. The patient expressed understanding and agreed to proceed. Subjective:  Kathryn Barnes is a 33 y.o. G2P1001 at [redacted]w[redacted]d being seen today for ongoing prenatal care.  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  Patient reports no complaints.  Contractions: Irregular. Vag. Bleeding: None.  Movement: Present. Denies any leaking of fluid.   The following portions of the patient's history were reviewed and updated as appropriate: allergies, current medications, past family history, past medical history, past social history, past surgical history and problem list.   Objective:  There were no vitals filed for this visit.  Fetal Status:     Movement: Present     General:  Alert, oriented and cooperative. Patient is in no acute distress.  Respiratory: Normal respiratory effort, no problems with respiration noted  Mental Status: Normal mood and affect. Normal behavior. Normal judgment and thought content.  Rest of physical exam deferred due to type of encounter  Imaging: [place] MFM FETAL  BPP WO NON STRESS  Result Date: 01/07/2019 ----------------------------------------------------------------------  OBSTETRICS REPORT                       (Signed Final 01/07/2019 10:56 pm) ---------------------------------------------------------------------- Patient Info  ID #:       [id number]                          D.O.B.:  November 20, 1985 (33 yrs)  Name:       Kathryn Barnes               Visit Date: 01/07/2019 02:52 pm ---------------------------------------------------------------------- Performed By  Performed By:     Valda Favia          Ref. Address:      [address]  [place]                                                              [place]  Attending:        Sander Nephew      [facility]                                        [facility]  Referred By:      [facility] ---------------------------------------------------------------------- Orders   #  Description                          Code         Ordered By   1  [place] MFM FETAL BPP WO NON              76819.01     RAVI SHANKAR      STRESS   2  [place] MFM UA CORD DOPPLER               76820.02     RAVI SHANKAR   3  [place] MFM OB FOLLOW UP                  76816.01     RAVI [facility]  ----------------------------------------------------------------------   #  Order #                    Accession #                 Episode #   1  [id number]                  [id number]   2  [id number]                  [id number]   3  [id number]                  [id number]  ---------------------------------------------------------------------- Indications   [redacted] weeks gestation of pregnancy                Z3A.34   Maternal care for  known or suspected poor      O36.5931   fetal growth, third trimester, fetus 1 IUGR   Fetal abnormality - other known or             O35.9XX0   suspected (fetal heart defect)   Polyhydramnios, third trimester, antepartum    O40.3XX0   condition or complication, unspecified fetus   Poor obstetric history: Previous               O09.299   preeclampsia / eclampsia/gestational HTN   (  ASA)   Late to prenatal care, third trimester         O09.33   Fetal abnormality - other known or             O35.9XX0   suspected (fetal heart defect)   Polyhydramnios, third trimester, antepartum    O40.3XX0   condition or complication, unspecified fetus  ---------------------------------------------------------------------- Vital Signs                                                 Height:        5'4" ---------------------------------------------------------------------- Fetal Evaluation  Num Of Fetuses:          1  Fetal Heart Rate(bpm):   146  Cardiac Activity:        Observed  Presentation:            Cephalic  Placenta:                Anterior  P. Cord Insertion:       Visualized, central  Amniotic Fluid  AFI FV:      Within normal limits  AFI Sum(cm)     %Tile       Largest Pocket(cm)  23.8            91          7.62  RUQ(cm)       RLQ(cm)       LUQ(cm)        LLQ(cm)  6.45          7.62          4.56           5.17 ---------------------------------------------------------------------- Biometry  BPD:      83.3  mm     G. Age:  33w 4d         21  %    CI:        76.88   %    70 - 86                                                          FL/HC:       18.5  %    20.1 - 22.3  HC:      300.9  mm     G. Age:  33w 3d          3  %    HC/AC:       1.04       0.93 - 1.11  AC:      288.9  mm     G. Age:  32w 6d         13  %    FL/BPD:      67.0  %    71 - 87  FL:       55.8  mm     G. Age:  29w 3d        < 1  %    FL/AC:       19.3  %    20 - 24  Est. FW:    1873   gm  4 lb 2 oz      2  %  ---------------------------------------------------------------------- OB History  Gravidity:    2         Term:   1        Prem:   0        SAB:   0  TOP:          0       Ectopic:  0        Living: 1 ---------------------------------------------------------------------- Gestational Age  LMP:           34w 4d        Date:  05/10/18                 EDD:   02/14/19  U/S Today:     32w 2d                                        EDD:   03/02/19  Best:          34w 4d     Det. By:  LMP  (05/10/18)          EDD:   02/14/19 ---------------------------------------------------------------------- Anatomy  Cranium:               Appears normal         Aortic Arch:            Not well visualized  Cavum:                 Previously seen        Ductal Arch:            Not well visualized  Ventricles:            Appears normal         Diaphragm:              Previously seen  Choroid Plexus:        Previously seen        Stomach:                Appears normal, left                                                                        sided  Cerebellum:            Abnormal, see          Abdomen:                Previously seen                         comments  Nuchal Fold:           Not applicable (Q000111Q    Abdominal Wall:         Previously seen                         wks GA)  Face:  Absent nasal bone      Cord Vessels:           Previously seen                         prev vis  Lips:                  Previously seen        Kidneys:                Appear normal  Thoracic:              Appears normal         Bladder:                Appears normal  Heart:                 Abnormal, see          Spine:                  Limited views prev                         comments                                                                        visualized  RVOT:                  Not well visualized    Upper Extremities:      Previously seen  LVOT:                  Not well visualized    Lower Extremities:      Previously seen   Other:  Technically difficult due to fetal position. 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 ---------------------------------------------------------------------- Recommendations  Continue weekly testing with UA Dopplers  Repeat growth in weekly 4 weeks  Consider delivery by 37 weeks given EFW <3% ----------------------------------------------------------------------               Sander Nephew, MD Electronically Signed Final Report   01/07/2019 10:56 pm ----------------------------------------------------------------------  [place] MFM FETAL BPP WO NON STRESS  Result Date: 01/01/2019 ----------------------------------------------------------------------  OBSTETRICS REPORT                       (Signed  Final 01/01/2019 03:59 pm) ---------------------------------------------------------------------- Patient Info  ID #:       [id number]                          D.O.B.:  [birth year] (33 yrs)  Name:       Kathryn Barnes               Visit Date: 01/01/2019 02:34 pm ---------------------------------------------------------------------- Performed By  Performed By:     Vianne Bulls Tester BS,       Ref. Address:     [address] [place]                                                             [place]  Attending:        Johnell Comings MD         [facility]                                                             [facility]  Referred By:      [place] ---------------------------------------------------------------------- Orders   #  Description                          Code         Ordered By   1  [place] MFM FETAL BPP WO NON              76819.01     RAVI SHANKAR      STRESS   2  [place] MFM UA CORD DOPPLER               [id number]     RAVI SHANKAR  ----------------------------------------------------------------------   #  Order #  Accession #                 Episode #   1  [id number]                  [id number]   2  [id number]                  [id number]                  [id number]  ---------------------------------------------------------------------- Indications   Maternal care for known or suspected poor      O36.5931   fetal growth, third trimester, fetus 1 IUGR   Fetal abnormality - other known or             O35.9XX0   suspected (fetal heart defect)   Polyhydramnios, third trimester, antepartum    O40.3XX0   condition or complication, unspecified fetus   Poor obstetric history: Previous               O09.299   preeclampsia / eclampsia/gestational HTN   (ASA)   [redacted] weeks gestation of pregnancy                Z3A.33   Late to prenatal care, third trimester         O09.33   Fetal abnormality - other known or             O35.9XX0   suspected (fetal heart defect)   Polyhydramnios, third trimester, antepartum    O40.3XX0   condition or complication, unspecified fetus  ---------------------------------------------------------------------- Vital Signs                                                 Height:        5'4" ---------------------------------------------------------------------- Fetal Evaluation  Num Of Fetuses:         1  Fetal Heart Rate(bpm):  138  Cardiac Activity:       Observed  Presentation:           Cephalic  Placenta:               Anterior  P. Cord Insertion:      Previously Visualized   Amniotic Fluid  AFI FV:      Within normal limits  AFI Sum(cm)     %Tile       Largest Pocket(cm)  19.56           73          7.49  RUQ(cm)       RLQ(cm)       LUQ(cm)        LLQ(cm)  3.68          3.42          4.97           7.49 ---------------------------------------------------------------------- Biophysical Evaluation  Amniotic F.V:   Pocket => 2 cm             F. Tone:        Observed  F. Movement:    Observed                   Score:  8/8  F. Breathing:   Observed ---------------------------------------------------------------------- Biometry  LV:        3.2  mm ---------------------------------------------------------------------- OB History  Gravidity:    2         Term:   1        Prem:   0        SAB:   0  TOP:          0       Ectopic:  0        Living: 1 ---------------------------------------------------------------------- Gestational Age  LMP:           33w 5d        Date:  05/10/18                 EDD:   02/14/19  Best:          33w 5d     Det. By:  LMP  (05/10/18)          EDD:   02/14/19 ---------------------------------------------------------------------- Anatomy  Cranium:               Previously seen        Aortic Arch:            Not well visualized  Cavum:                 Previously seen        Ductal Arch:            Not well visualized  Ventricles:            Previously seen        Diaphragm:              Previously seen  Choroid Plexus:        Previously seen        Stomach:                Previously Seen  Cerebellum:            Abnormal, see          Abdomen:                Previously seen                         comments  Nuchal Fold:           Not applicable (Q000111Q    Abdominal Wall:         Previously seen                         wks GA)  Face:                  Absent nasal bone      Cord Vessels:           Previously seen                         prev vis  Lips:                  Previously seen        Kidneys:                Previously seen  Palate:                Not well  visualized    Bladder:  Previously seen  Thoracic:              Previously seen        Spine:                  Limited views prev                                                                        visualized  Heart:                 Abnormal, see          Upper Extremities:      Previously seen                         comments  RVOT:                  Not well visualized    Lower Extremities:      Previously seen  LVOT:                  Not well visualized  Other:  Technically difficult due to fetal position. ---------------------------------------------------------------------- Doppler - Fetal Vessels  Umbilical Artery   S/D     %tile     RI              PI                     ADFV    RDFV  3.81       96   0.74             1.25                        No      No ---------------------------------------------------------------------- Cervix Uterus Adnexa  Cervix  Not visualized (advanced GA >24wks)  Uterus  No abnormality visualized.  Left Ovary  Within normal limits. No adnexal mass visualized.  Right Ovary  Within normal limits. No adnexal mass visualized.  Cul De Sac  No free fluid seen.  Adnexa  No abnormality visualized. ---------------------------------------------------------------------- Comments  This patient was seen due to an IUGR fetus.  Her fetus has  also been noted to have an AV canal defect.  She denies any  problems since her last exam.  A biophysical profile performed today was 8 out of 8.  There was normal amniotic fluid noted on today's ultrasound  exam.  Doppler studies of the umbilical arteries performed due to  fetal growth restriction showed a normal S/D ratio of 3.81.  A follow-up exam was scheduled in 1 week. ----------------------------------------------------------------------                   Johnell Comings, MD Electronically Signed Final Report   01/01/2019 03:59 pm ----------------------------------------------------------------------  [place] MFM FETAL BPP WO NON  STRESS  Result Date: 12/26/2018 ----------------------------------------------------------------------  OBSTETRICS REPORT                       (Signed Final 12/26/2018 03:59 pm) ---------------------------------------------------------------------- Patient Info  ID #:       [id number]  D.O.B.:  [birth year] (33 yrs)  Name:       Kathryn Barnes               Visit Date: 12/26/2018 03:00 pm ---------------------------------------------------------------------- Performed By  Performed By:     Valda Favia          Ref. Address:     [address] [place]                                                             [place]  Attending:        Tama High MD        [facility]  Referred By:      [place] ---------------------------------------------------------------------- Orders   #  Description                          Code         Ordered By   1  [place] MFM FETAL BPP WO NON              76819.01     RAVI SHANKAR      STRESS   2  [place] MFM UA CORD DOPPLER               [id number]     RAVI [facility]  ----------------------------------------------------------------------   #  Order #                    Accession #                 Episode #   1  [id number]                  [id number]   2  [id number]                  [id number]                  [id number]  ---------------------------------------------------------------------- Indications   [redacted] weeks gestation of pregnancy                Z3A.75   Maternal care for known or suspected poor      O36.5931   fetal growth, third trimester, fetus 1 IUGR   Fetal abnormality - other known or             O35.9XX0   suspected  (fetal heart defect)   Polyhydramnios, third trimester, antepartum    O40.3XX0   condition or complication, unspecified fetus   Poor obstetric history: Previous               O09.299   preeclampsia / eclampsia/gestational HTN   (ASA)   Late to prenatal care, third trimester         O09.33  ---------------------------------------------------------------------- Vital Signs                                                 Height:        5'4" ---------------------------------------------------------------------- Fetal Evaluation  Num Of Fetuses:         1  Fetal Heart Rate(bpm):  121  Cardiac Activity:       Observed  Presentation:           Cephalic  Amniotic Fluid  AFI FV:      Within normal limits  AFI Sum(cm)     %Tile       Largest Pocket(cm)  23.05           89          6.64  RUQ(cm)       RLQ(cm)       LUQ(cm)        LLQ(cm)  6.32          4.39          6.64           5.7 ---------------------------------------------------------------------- Biophysical Evaluation  Amniotic F.V:   Within normal limits       F. Tone:        Observed  F. Movement:    Observed                   Score:          8/8  F. Breathing:   Observed ---------------------------------------------------------------------- OB History  Gravidity:    2         Term:   1        Prem:   0        SAB:   0  TOP:          0       Ectopic:  0        Living: 1 ---------------------------------------------------------------------- Gestational Age  LMP:           32w 6d        Date:  05/10/18  EDD:   02/14/19  Best:          Milderd Meager 6d     Det. By:  LMP  (05/10/18)          EDD:   02/14/19 ---------------------------------------------------------------------- Anatomy  Stomach:               Appears normal, left   Bladder:                Appears normal                         sided ---------------------------------------------------------------------- Doppler - Fetal Vessels  Umbilical Artery   S/D     %tile     RI              PI                      ADFV    RDFV  3.09       77   0.68              1.1                         N       N ---------------------------------------------------------------------- Impression  Severe fetal growth restriction.  Suspected Down syndrome.  AV canal defect confirmed on fetal echocardiography.  Amniotic fluid is normal and good fetal activity seen.  Umbilical artery Doppler showed normal forward diastolic  flow.  Antenatal testing is reassuring.  BPP 8/8.  Complete balanced AV canal defect is seen.  Posterior fossa  was difficult to evaluate but I could not identify vermian defect. ---------------------------------------------------------------------- Recommendations  -Continue weekly BPP and UA Doppler till delivery.  -Delivery at [redacted] weeks gestation. ----------------------------------------------------------------------                  Tama High, MD Electronically Signed Final Report   12/26/2018 03:59 pm ----------------------------------------------------------------------  [place] MFM FETAL BPP WO NON STRESS  Result Date: 12/19/2018 ----------------------------------------------------------------------  OBSTETRICS REPORT                       (Signed Final 12/19/2018 04:40 pm) ---------------------------------------------------------------------- Patient Info  ID #:       [id number]                          D.O.B.:  1985-08-10 (33 yrs)  Name:       Kathryn Barnes               Visit Date: 12/19/2018 03:37 pm ---------------------------------------------------------------------- Performed By  Performed By:     Georgie Chard        Ref. Address:     [address]  [place]                                                             [place]  Attending:        Tama High MD        [facility]  Referred By:      [place] ---------------------------------------------------------------------- Orders   #  Description                          Code         Ordered By   1  [place] MFM UA CORD DOPPLER               76820.02     RAVI SHANKAR   2  [place] MFM FETAL BPP WO NON              76819.01     [place]   3  [place] MFM OB FOLLOW UP                  [id number]     RAVI SHANKAR  ----------------------------------------------------------------------   #  Order #                    Accession #                 Episode #   1  [id number]                  [id number]   2  [id number]                  [id number]                  [id number]   3  [id number]                  [id number]                  [id number]  ---------------------------------------------------------------------- Indications   Maternal care for known or suspected poor      O36.5931   fetal growth, third trimester, fetus 1 IUGR   Fetal abnormality - other known or             O35.9XX0   suspected (fetal heart defect)   Polyhydramnios, third trimester,  antepartum    O40.3XX0   condition or complication, unspecified fetus   Poor obstetric history: Previous               O09.299   preeclampsia / eclampsia/gestational HTN   (ASA)   Late to prenatal care, third trimester         O09.33   [redacted] weeks gestation of pregnancy                Z3A.31  ---------------------------------------------------------------------- Vital Signs                                                 Height:        5'4" ---------------------------------------------------------------------- Fetal Evaluation  Num Of Fetuses:         1  Fetal Heart Rate(bpm):  151  Cardiac Activity:       Observed  Amniotic Fluid  AFI FV:      Polyhydramnios  AFI Sum(cm)     %Tile       Largest Pocket(cm)  29.12           > 97        10.59  RUQ(cm)       RLQ(cm)       LUQ(cm)        LLQ(cm)   10.59         5.47          4.92           8.14 ---------------------------------------------------------------------- Biophysical Evaluation  Amniotic F.V:   Within normal limits       F. Tone:        Observed  F. Movement:    Observed                   Score:          8/8  F. Breathing:   Observed ---------------------------------------------------------------------- Biometry  BPD:      77.8  mm     G. Age:  31w 2d         22  %    CI:        82.89   %    70 - 86                                                          FL/HC:      19.2   %    19.1 - 21.3  HC:      269.5  mm     G. Age:  29w 3d        < 1  %    HC/AC:      1.02        0.96 - 1.17  AC:       263   mm     G. Age:  30w 3d         12  %    FL/BPD:     66.5   %    71 - 87  FL:       51.7  mm     G. Age:  27w 4d        <  1  %    FL/AC:      19.7   %    20 - 24  HUM:      45.2  mm     G. Age:  26w 5d        < 5  %  LV:          3  mm  Est. FW:    1402  gm      3 lb 1 oz      2  % ---------------------------------------------------------------------- OB History  Gravidity:    2         Term:   1        Prem:   0        SAB:   0  TOP:          0       Ectopic:  0        Living: 1 ---------------------------------------------------------------------- Gestational Age  LMP:           31w 6d        Date:  05/10/18                 EDD:   02/14/19  U/S Today:     29w 5d                                        EDD:   03/01/19  Best:          31w 6d     Det. By:  LMP  (05/10/18)          EDD:   02/14/19 ---------------------------------------------------------------------- Doppler - Fetal Vessels  Umbilical Artery   S/D     %tile                                            ADFV    RDFV  2.23       21                                                No      No ---------------------------------------------------------------------- Impression  Severe fetal growth restriction.  AV canal defect confirmed by  fetal echocardiography.  Suspected fetal Down syndrome.  Mild  polyhydramnios is seen.  Good fetal activity is present.  The estimated fetal weight is at the 2nd percentile.  Interval  weight gain is 532 g over 3 weeks.Amniotic fluid is normal  and good fetal activity is seen. Antenatal testing is  reassuring. BPP 8/8. Umbilical artery Doppler showed normal  forward diastolic flow.  BP at our office: 110/69 mm Hg. ---------------------------------------------------------------------- Recommendations  -Continue weekly BPP and Doppler. ----------------------------------------------------------------------                  Tama High, MD Electronically Signed Final Report   12/19/2018 04:40 pm ----------------------------------------------------------------------  [place] MFM OB FOLLOW UP  Result Date: 01/07/2019 ----------------------------------------------------------------------  OBSTETRICS REPORT                       (Signed Final 01/07/2019 10:56 pm) ---------------------------------------------------------------------- Patient Info  ID #:  [id number]                          D.O.B.:  1985/11/21 (33 yrs)  Name:       Kathryn Barnes               Visit Date: 01/07/2019 02:52 pm ---------------------------------------------------------------------- Performed By  Performed By:     Valda Favia          Ref. Address:      [place]                                                              [place]  Attending:        Sander Nephew      [facility]                    MD                                        [facility]  Referred By:      [place] ---------------------------------------------------------------------- Orders   #  Description                          Code         Ordered By   1  [place] MFM  FETAL BPP WO NON              76819.01     RAVI SHANKAR      STRESS   2  [place] MFM UA CORD DOPPLER               76820.02     RAVI SHANKAR   3  [place] MFM OB FOLLOW UP                  [id number]  Tama High  ----------------------------------------------------------------------   #  Order #                    Accession #                 Episode #   1  [id number]                  [id number]                  [id number]   2  [id number]                  [id number]                  [id number]   3  [id number]                  [id number]                  [id number]  ---------------------------------------------------------------------- Indications   [redacted] weeks gestation of pregnancy                Z3A.34   Maternal care for known or suspected poor      O36.5931   fetal growth, third trimester, fetus 1 IUGR   Fetal abnormality - other known or             O35.9XX0   suspected (fetal heart defect)   Polyhydramnios, third trimester, antepartum    O40.3XX0   condition or complication, unspecified fetus   Poor obstetric history: Previous               O09.299   preeclampsia / eclampsia/gestational HTN   (ASA)   Late to prenatal care, third trimester         O09.33   Fetal abnormality - other known or             O35.9XX0   suspected (fetal heart defect)   Polyhydramnios, third trimester, antepartum    O40.3XX0   condition or complication, unspecified fetus  ---------------------------------------------------------------------- Vital Signs                                                 Height:        5'4" ---------------------------------------------------------------------- Fetal Evaluation  Num Of Fetuses:          1  Fetal Heart Rate(bpm):   146  Cardiac Activity:        Observed  Presentation:            Cephalic  Placenta:                Anterior  P. Cord Insertion:       Visualized, central  Amniotic Fluid  AFI FV:      Within normal limits  AFI Sum(cm)     %Tile       Largest Pocket(cm)  23.8            91          7.62  RUQ(cm)        RLQ(cm)       LUQ(cm)        LLQ(cm)  6.45          7.62          4.56  5.17 ---------------------------------------------------------------------- Biometry  BPD:      83.3  mm     G. Age:  33w 4d         21  %    CI:        76.88   %    70 - 86                                                          FL/HC:       18.5  %    20.1 - 22.3  HC:      300.9  mm     G. Age:  33w 3d          3  %    HC/AC:       1.04       0.93 - 1.11  AC:      288.9  mm     G. Age:  32w 6d         13  %    FL/BPD:      67.0  %    71 - 87  FL:       55.8  mm     G. Age:  29w 3d        < 1  %    FL/AC:       19.3  %    20 - 24  Est. FW:    1873   gm     4 lb 2 oz      2  % ---------------------------------------------------------------------- OB History  Gravidity:    2         Term:   1        Prem:   0        SAB:   0  TOP:          0       Ectopic:  0        Living: 1 ---------------------------------------------------------------------- Gestational Age  LMP:           34w 4d        Date:  05/10/18                 EDD:   02/14/19  U/S Today:     32w 2d                                        EDD:   03/02/19  Best:          34w 4d     Det. By:  LMP  (05/10/18)          EDD:   02/14/19 ---------------------------------------------------------------------- Anatomy  Cranium:               Appears normal         Aortic Arch:            Not well visualized  Cavum:                 Previously seen        Ductal Arch:            Not well visualized  Ventricles:  Appears normal         Diaphragm:              Previously seen  Choroid Plexus:        Previously seen        Stomach:                Appears normal, left                                                                        sided  Cerebellum:            Abnormal, see          Abdomen:                Previously seen                         comments  Nuchal Fold:           Not applicable (Q000111Q    Abdominal Wall:         Previously seen                         wks GA)   Face:                  Absent nasal bone      Cord Vessels:           Previously seen                         prev vis  Lips:                  Previously seen        Kidneys:                Appear normal  Thoracic:              Appears normal         Bladder:                Appears normal  Heart:                 Abnormal, see          Spine:                  Limited views prev                         comments                                                                        visualized  RVOT:                  Not well visualized    Upper Extremities:      Previously seen  LVOT:  Not well visualized    Lower Extremities:      Previously seen  Other:  Technically difficult due to fetal position. 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 ---------------------------------------------------------------------- Recommendations  Continue weekly testing with UA Dopplers  Repeat growth in weekly 4 weeks  Consider delivery by 37 weeks given EFW <3% ----------------------------------------------------------------------               Sander Nephew, MD Electronically Signed Final Report   01/07/2019 10:56 pm ----------------------------------------------------------------------  [place] MFM OB FOLLOW UP  Result Date: 12/19/2018 ----------------------------------------------------------------------  OBSTETRICS REPORT                       (Signed Final 12/19/2018 04:40 pm) ---------------------------------------------------------------------- Patient Info  ID #:        [id number]                          D.O.B.:  12-13-1985 (33 yrs)  Name:       Kathryn Barnes               Visit Date: 12/19/2018 03:37 pm ---------------------------------------------------------------------- Performed By  Performed By:     Georgie Chard        Ref. Address:     [place]  [place]  Attending:        Tama High MD        [facility]                                                             [facility]  Referred By:      [place] ---------------------------------------------------------------------- Orders   #  Description                          Code         Ordered By   1  [place] MFM UA CORD DOPPLER               76820.02     RAVI SHANKAR   2  [place] MFM FETAL BPP WO NON              76819.01     [place]   3  [place] MFM OB FOLLOW UP                  76816.01     RAVI [facility]  ----------------------------------------------------------------------   #  Order #                    Accession #                 Episode #   1  [id number]                  [id number]                  [id number]   2  [id number]                  [id number]                  [id number]   3  [id number]                  [id number]                  [id number]  ---------------------------------------------------------------------- Indications   Maternal care for known or suspected poor      O36.5931   fetal growth, third trimester, fetus 1 IUGR   Fetal abnormality - other known or             O35.9XX0   suspected (fetal heart defect)   Polyhydramnios, third trimester, antepartum    O40.3XX0   condition or complication, unspecified fetus   Poor obstetric history: Previous               O09.299   preeclampsia /  eclampsia/gestational HTN   (ASA)   Late to prenatal care, third trimester         O09.33   [redacted] weeks gestation of pregnancy                Z3A.31  ---------------------------------------------------------------------- Vital Signs  Height:        5'4" ---------------------------------------------------------------------- Fetal Evaluation  Num Of Fetuses:         1  Fetal Heart Rate(bpm):  151  Cardiac Activity:       Observed  Amniotic Fluid  AFI FV:      Polyhydramnios  AFI Sum(cm)     %Tile       Largest Pocket(cm)  29.12           > 97        10.59  RUQ(cm)       RLQ(cm)       LUQ(cm)        LLQ(cm)  10.59         5.47          4.92           8.14 ---------------------------------------------------------------------- Biophysical Evaluation  Amniotic F.V:   Within normal limits       F. Tone:        Observed  F. Movement:    Observed                   Score:          8/8  F. Breathing:   Observed ---------------------------------------------------------------------- Biometry  BPD:      77.8  mm     G. Age:  31w 2d         22  %    CI:        82.89   %    70 - 86                                                          FL/HC:      19.2   %    19.1 - 21.3  HC:      269.5  mm     G. Age:  29w 3d        < 1  %    HC/AC:      1.02        0.96 - 1.17  AC:       263   mm     G. Age:  30w 3d         12  %    FL/BPD:     66.5   %    71 - 87  FL:       51.7  mm     G. Age:  27w 4d        < 1  %    FL/AC:      19.7   %    20 - 24  HUM:      45.2  mm     G. Age:  26w 5d        < 5  %  LV:          3  mm  Est. FW:    1402  gm      3 lb 1 oz      2  % ---------------------------------------------------------------------- OB History  Gravidity:    2         Term:   1        Prem:   0  SAB:   0  TOP:          0       Ectopic:  0        Living: 1 ---------------------------------------------------------------------- Gestational Age  LMP:           31w 6d        Date:  05/10/18                  EDD:   02/14/19  U/S Today:     29w 5d                                        EDD:   03/01/19  Best:          31w 6d     Det. By:  LMP  (05/10/18)          EDD:   02/14/19 ---------------------------------------------------------------------- Doppler - Fetal Vessels  Umbilical Artery   S/D     %tile                                            ADFV    RDFV  2.23       21                                                No      No ---------------------------------------------------------------------- Impression  Severe fetal growth restriction.  AV canal defect confirmed by  fetal echocardiography.  Suspected fetal Down syndrome.  Mild polyhydramnios is seen.  Good fetal activity is present.  The estimated fetal weight is at the 2nd percentile.  Interval  weight gain is 532 g over 3 weeks.Amniotic fluid is normal  and good fetal activity is seen. Antenatal testing is  reassuring. BPP 8/8. Umbilical artery Doppler showed normal  forward diastolic flow.  BP at our office: 110/69 mm Hg. ---------------------------------------------------------------------- Recommendations  -Continue weekly BPP and Doppler. ----------------------------------------------------------------------                  Tama High, MD Electronically Signed Final Report   12/19/2018 04:40 pm ----------------------------------------------------------------------  [place] MFM UA CORD DOPPLER  Result Date: 01/07/2019 ----------------------------------------------------------------------  OBSTETRICS REPORT                       (Signed Final 01/07/2019 10:56 pm) ---------------------------------------------------------------------- Patient Info  ID #:       [id number]                          D.O.B.:  [birth year] (33 yrs)  Name:       Kathryn Barnes               Visit Date: 01/07/2019 02:52 pm ---------------------------------------------------------------------- Performed By  Performed By:     Valda Favia          Ref. Address:      [place]  [address]                                                              [address]                                                              [place]                                                              [place]  Attending:        Sander Nephew      [facility]                    MD                                        [facility]  Referred By:      [facility] ---------------------------------------------------------------------- Orders   #  Description                          Code         Ordered By   1  [place] MFM FETAL BPP WO NON              76819.01     RAVI SHANKAR      STRESS   2  [place] MFM UA CORD DOPPLER               76820.02     [place]   3  [place] MFM OB FOLLOW UP                  [id number]     RAVI SHANKAR  ----------------------------------------------------------------------   #  Order #                    Accession #                 Episode #   1  [id number]                  [id number]                  [id number]   2  [id number]                  [id number]                  NL:[id number]   3  [id number]                  [id number]  [id number]  ---------------------------------------------------------------------- Indications   [redacted] weeks gestation of pregnancy                Z3A.34   Maternal care for known or suspected poor      O36.5931   fetal growth, third trimester, fetus 1 IUGR   Fetal abnormality - other known or             O35.9XX0   suspected (fetal heart defect)   Polyhydramnios, third trimester, antepartum    O40.3XX0   condition or complication, unspecified fetus   Poor obstetric history: Previous               O09.299   preeclampsia / eclampsia/gestational HTN   (ASA)   Late to prenatal care, third trimester         O09.33   Fetal abnormality - other known or             O35.9XX0   suspected (fetal heart defect)   Polyhydramnios, third trimester, antepartum     O40.3XX0   condition or complication, unspecified fetus  ---------------------------------------------------------------------- Vital Signs                                                 Height:        5'4" ---------------------------------------------------------------------- Fetal Evaluation  Num Of Fetuses:          1  Fetal Heart Rate(bpm):   146  Cardiac Activity:        Observed  Presentation:            Cephalic  Placenta:                Anterior  P. Cord Insertion:       Visualized, central  Amniotic Fluid  AFI FV:      Within normal limits  AFI Sum(cm)     %Tile       Largest Pocket(cm)  23.8            91          7.62  RUQ(cm)       RLQ(cm)       LUQ(cm)        LLQ(cm)  6.45          7.62          4.56           5.17 ---------------------------------------------------------------------- Biometry  BPD:      83.3  mm     G. Age:  33w 4d         21  %    CI:        76.88   %    70 - 86                                                          FL/HC:       18.5  %    20.1 - 22.3  HC:      300.9  mm     G. Age:  33w 3d          3  %    HC/AC:  1.04       0.93 - 1.11  AC:      288.9  mm     G. Age:  32w 6d         13  %    FL/BPD:      67.0  %    71 - 87  FL:       55.8  mm     G. Age:  29w 3d        < 1  %    FL/AC:       19.3  %    20 - 24  Est. FW:    1873   gm     4 lb 2 oz      2  % ---------------------------------------------------------------------- OB History  Gravidity:    2         Term:   1        Prem:   0        SAB:   0  TOP:          0       Ectopic:  0        Living: 1 ---------------------------------------------------------------------- Gestational Age  LMP:           34w 4d        Date:  05/10/18                 EDD:   02/14/19  U/S Today:     32w 2d                                        EDD:   03/02/19  Best:          34w 4d     Det. By:  LMP  (05/10/18)          EDD:   02/14/19 ---------------------------------------------------------------------- Anatomy  Cranium:                Appears normal         Aortic Arch:            Not well visualized  Cavum:                 Previously seen        Ductal Arch:            Not well visualized  Ventricles:            Appears normal         Diaphragm:              Previously seen  Choroid Plexus:        Previously seen        Stomach:                Appears normal, left                                                                        sided  Cerebellum:            Abnormal, see  Abdomen:                Previously seen                         comments  Nuchal Fold:           Not applicable (Q000111Q    Abdominal Wall:         Previously seen                         wks GA)  Face:                  Absent nasal bone      Cord Vessels:           Previously seen                         prev vis  Lips:                  Previously seen        Kidneys:                Appear normal  Thoracic:              Appears normal         Bladder:                Appears normal  Heart:                 Abnormal, see          Spine:                  Limited views prev                         comments                                                                        visualized  RVOT:                  Not well visualized    Upper Extremities:      Previously seen  LVOT:                  Not well visualized    Lower Extremities:      Previously seen  Other:  Technically difficult due to fetal position. 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 ----------------------------------------------------------------------  Recommendations  Continue weekly testing with UA Dopplers  Repeat growth in weekly 4 weeks  Consider delivery by 37 weeks given EFW <3% ----------------------------------------------------------------------               Sander Nephew, MD Electronically Signed Final Report   01/07/2019 10:56 pm ----------------------------------------------------------------------  [place] MFM UA CORD DOPPLER  Result Date: 01/01/2019 ----------------------------------------------------------------------  OBSTETRICS REPORT                       (Signed Final 01/01/2019 03:59 pm) ---------------------------------------------------------------------- Patient Info  ID #:       [id number]                          D.O.B.:  September 29, 1985 (33 yrs)  Name:       Kathryn Barnes               Visit Date: 01/01/2019 02:34 pm ---------------------------------------------------------------------- Performed By  Performed By:     Vianne Bulls Tester BS,       Ref. Address:     [address]                                                             [address]                                                             [place]                                                             [place]  Attending:        Johnell Comings MD         [facility]  Referred By:      [place] ---------------------------------------------------------------------- Orders   #  Description  Code         Ordered By   1  [place] MFM FETAL BPP WO NON              [id number]     RAVI SHANKAR      STRESS   2  [place] MFM UA CORD DOPPLER               76820.02     RAVI [facility]  ----------------------------------------------------------------------   #  Order #                    Accession #                 Episode #   1  [id number]                  [id number]                   [id number]   2  [id number]                  [id number]                  [id number]  ---------------------------------------------------------------------- Indications   Maternal care for known or suspected poor      O36.5931   fetal growth, third trimester, fetus 1 IUGR   Fetal abnormality - other known or             O35.9XX0   suspected (fetal heart defect)   Polyhydramnios, third trimester, antepartum    O40.3XX0   condition or complication, unspecified fetus   Poor obstetric history: Previous               O09.299   preeclampsia / eclampsia/gestational HTN   (ASA)   [redacted] weeks gestation of pregnancy                Z3A.33   Late to prenatal care, third trimester         O09.33   Fetal abnormality - other known or             O35.9XX0   suspected (fetal heart defect)   Polyhydramnios, third trimester, antepartum    O40.3XX0   condition or complication, unspecified fetus  ---------------------------------------------------------------------- Vital Signs                                                 Height:        5'4" ---------------------------------------------------------------------- Fetal Evaluation  Num Of Fetuses:         1  Fetal Heart Rate(bpm):  138  Cardiac Activity:       Observed  Presentation:           Cephalic  Placenta:               Anterior  P. Cord Insertion:      Previously Visualized  Amniotic Fluid  AFI FV:      Within normal limits  AFI Sum(cm)     %Tile       Largest Pocket(cm)  19.56           73          7.49  RUQ(cm)       RLQ(cm)       LUQ(cm)  LLQ(cm)  3.68          3.42          4.97           7.49 ---------------------------------------------------------------------- Biophysical Evaluation  Amniotic F.V:   Pocket => 2 cm             F. Tone:        Observed  F. Movement:    Observed                   Score:          8/8  F. Breathing:   Observed ---------------------------------------------------------------------- Biometry  LV:        3.2  mm  ---------------------------------------------------------------------- OB History  Gravidity:    2         Term:   1        Prem:   0        SAB:   0  TOP:          0       Ectopic:  0        Living: 1 ---------------------------------------------------------------------- Gestational Age  LMP:           33w 5d        Date:  05/10/18                 EDD:   02/14/19  Best:          33w 5d     Det. By:  LMP  (05/10/18)          EDD:   02/14/19 ---------------------------------------------------------------------- Anatomy  Cranium:               Previously seen        Aortic Arch:            Not well visualized  Cavum:                 Previously seen        Ductal Arch:            Not well visualized  Ventricles:            Previously seen        Diaphragm:              Previously seen  Choroid Plexus:        Previously seen        Stomach:                Previously Seen  Cerebellum:            Abnormal, see          Abdomen:                Previously seen                         comments  Nuchal Fold:           Not applicable ([id number]    Abdominal Wall:         Previously seen                         wks GA)  Face:                  Absent nasal bone      Cord Vessels:  Previously seen                         prev vis  Lips:                  Previously seen        Kidneys:                Previously seen  Palate:                Not well visualized    Bladder:                Previously seen  Thoracic:              Previously seen        Spine:                  Limited views prev                                                                        visualized  Heart:                 Abnormal, see          Upper Extremities:      Previously seen                         comments  RVOT:                  Not well visualized    Lower Extremities:      Previously seen  LVOT:                  Not well visualized  Other:  Technically difficult due to fetal position.  ---------------------------------------------------------------------- Doppler - Fetal Vessels  Umbilical Artery   S/D     %tile     RI              PI                     ADFV    RDFV  3.81       96   0.74             1.25                        No      No ---------------------------------------------------------------------- Cervix Uterus Adnexa  Cervix  Not visualized (advanced GA >24wks)  Uterus  No abnormality visualized.  Left Ovary  Within normal limits. No adnexal mass visualized.  Right Ovary  Within normal limits. No adnexal mass visualized.  Cul De Sac  No free fluid seen.  Adnexa  No abnormality visualized. ---------------------------------------------------------------------- Comments  This patient was seen due to an IUGR fetus.  Her fetus has  also been noted to have an AV canal defect.  She denies any  problems since her last exam.  A biophysical profile performed today was 8 out of 8.  There was normal amniotic fluid noted on today's ultrasound  exam.  Doppler studies of the umbilical arteries performed due to  fetal growth restriction showed a normal S/D ratio of 3.81.  A follow-up exam was scheduled in 1 week. ----------------------------------------------------------------------                   Johnell Comings, MD Electronically Signed Final Report   01/01/2019 03:59 pm ----------------------------------------------------------------------  [place] MFM UA CORD DOPPLER  Result Date: 12/26/2018 ----------------------------------------------------------------------  OBSTETRICS REPORT                       (Signed Final 12/26/2018 03:59 pm) ---------------------------------------------------------------------- Patient Info  ID #:       [id number]                          D.O.B.:  1985/03/14 (33 yrs)  Name:       Kathryn Barnes               Visit Date: 12/26/2018 03:00 pm ---------------------------------------------------------------------- Performed By  Performed By:     Valda Favia          Ref.  Address:     [place]                                                             [place]  Attending:        Tama High MD        [facility]  Referred By:      [place] ---------------------------------------------------------------------- Orders   #  Description                          Code  Ordered By   1  [place] MFM FETAL BPP WO NON              [id number]     RAVI SHANKAR      [place]   2  [place] MFM UA CORD DOPPLER               [id number]     RAVI [facility]  ----------------------------------------------------------------------   #  Order #                    Accession #                 Episode #   1  [id number]                  [id number]   2  [id number]                  [id number]                  [id number]  ---------------------------------------------------------------------- Indications   [redacted] weeks gestation of pregnancy                Z3A.33   Maternal care for known or suspected poor      O36.5931   fetal growth, third trimester, fetus 1 IUGR   Fetal abnormality - other known or             O35.9XX0   suspected (fetal heart defect)   Polyhydramnios, third trimester, antepartum    O40.3XX0   condition or complication, unspecified fetus   Poor obstetric history: Previous               O09.299   preeclampsia / eclampsia/gestational HTN   (ASA)   Late to prenatal care, third trimester         O09.33  ---------------------------------------------------------------------- Vital Signs                                                 Height:        5'4" ---------------------------------------------------------------------- Fetal Evaluation  Num Of Fetuses:          1  Fetal Heart Rate(bpm):  121  Cardiac Activity:       Observed  Presentation:           Cephalic  Amniotic Fluid  AFI FV:      Within normal limits  AFI Sum(cm)     %Tile       Largest Pocket(cm)  23.05           89          6.64  RUQ(cm)       RLQ(cm)       LUQ(cm)        LLQ(cm)  6.32          4.39          6.64           5.7 ---------------------------------------------------------------------- Biophysical Evaluation  Amniotic F.V:   Within normal limits       F. Tone:        Observed  F. Movement:    Observed  Score:          8/8  F. Breathing:   Observed ---------------------------------------------------------------------- OB History  Gravidity:    2         Term:   1        Prem:   0        SAB:   0  TOP:          0       Ectopic:  0        Living: 1 ---------------------------------------------------------------------- Gestational Age  LMP:           32w 6d        Date:  05/10/18                 EDD:   02/14/19  Best:          Milderd Meager 6d     Det. By:  LMP  (05/10/18)          EDD:   02/14/19 ---------------------------------------------------------------------- Anatomy  Stomach:               Appears normal, left   Bladder:                Appears normal                         sided ---------------------------------------------------------------------- Doppler - Fetal Vessels  Umbilical Artery   S/D     %tile     RI              PI                     ADFV    RDFV  3.09       77   0.68              1.1                         N       N ---------------------------------------------------------------------- Impression  Severe fetal growth restriction.  Suspected Down syndrome.  AV canal defect confirmed on fetal echocardiography.  Amniotic fluid is normal and good fetal activity seen.  Umbilical artery Doppler showed normal forward diastolic  flow.  Antenatal testing is reassuring.  BPP 8/8.  Complete balanced AV canal defect is seen.  Posterior fossa  was difficult to evaluate but I could not  identify vermian defect. ---------------------------------------------------------------------- Recommendations  -Continue weekly BPP and UA Doppler till delivery.  -Delivery at [redacted] weeks gestation. ----------------------------------------------------------------------                  Tama High, MD Electronically Signed Final Report   12/26/2018 03:59 pm ----------------------------------------------------------------------  [place] MFM UA CORD DOPPLER  Result Date: 12/19/2018 ----------------------------------------------------------------------  OBSTETRICS REPORT                       (Signed Final 12/19/2018 04:40 pm) ---------------------------------------------------------------------- Patient Info  ID #:       [id number]                          D.O.B.:  Jul 31, 1985 (33 yrs)  Name:       Kathryn Barnes               Visit Date: 12/19/2018 03:37 pm ---------------------------------------------------------------------- Performed By  Performed By:     Deirdre Pippins  Adrien        Ref. Address:     [place]                                                             [place]  Attending:        Tama High MD        Location[facility]  Referred By:      [place] ---------------------------------------------------------------------- Orders   #  Description                          Code         Ordered By   1  [place] MFM UA CORD DOPPLER               76820.02     RAVI SHANKAR   2  [place] MFM FETAL BPP WO NON              76819.01     [place]   3  [place] MFM OB FOLLOW UP                  [id number]     [facility]  ----------------------------------------------------------------------   #  Order  #                    Accession #                 Episode #   1  [id number]                  [id number]  [id number]   2  [id number]                  [id number]   3  [id number]                  [id number]                  [id number]  ---------------------------------------------------------------------- Indications   Maternal care for known or suspected poor      O36.5931   fetal growth, third trimester, fetus 1 IUGR   Fetal abnormality - other known or             O35.9XX0   suspected (fetal heart defect)   Polyhydramnios, third trimester, antepartum    O40.3XX0   condition or complication, unspecified fetus   Poor obstetric history: Previous               O09.299   preeclampsia / eclampsia/gestational HTN   (ASA)   Late to prenatal care, third trimester         O09.33   [redacted] weeks gestation of pregnancy                Z3A.31  ---------------------------------------------------------------------- Vital Signs                                                 Height:        5'4" ---------------------------------------------------------------------- Fetal Evaluation  Num Of Fetuses:         1  Fetal Heart Rate(bpm):  151  Cardiac Activity:       Observed  Amniotic Fluid  AFI FV:      Polyhydramnios  AFI Sum(cm)     %Tile       Largest Pocket(cm)  29.12           > 97        10.59  RUQ(cm)       RLQ(cm)       LUQ(cm)        LLQ(cm)  10.59         5.47          4.92           8.14 ---------------------------------------------------------------------- Biophysical Evaluation  Amniotic F.V:   Within normal limits       F. Tone:        Observed  F. Movement:    Observed                   Score:          8/8  F. Breathing:   Observed ---------------------------------------------------------------------- Biometry  BPD:      77.8  mm     G. Age:  31w 2d         22  %    CI:        82.89   %    70 - 86  FL/HC:      19.2   %    19.1 - 21.3   HC:      269.5  mm     G. Age:  29w 3d        < 1  %    HC/AC:      1.02        0.96 - 1.17  AC:       263   mm     G. Age:  30w 3d         12  %    FL/BPD:     66.5   %    71 - 87  FL:       51.7  mm     G. Age:  27w 4d        < 1  %    FL/AC:      19.7   %    20 - 24  HUM:      45.2  mm     G. Age:  26w 5d        < 5  %  LV:          3  mm  Est. FW:    1402  gm      3 lb 1 oz      2  % ---------------------------------------------------------------------- OB History  Gravidity:    2         Term:   1        Prem:   0        SAB:   0  TOP:          0       Ectopic:  0        Living: 1 ---------------------------------------------------------------------- Gestational Age  LMP:           31w 6d        Date:  05/10/18                 EDD:   02/14/19  U/S Today:     29w 5d                                        EDD:   03/01/19  Best:          31w 6d     Det. By:  LMP  (05/10/18)          EDD:   02/14/19 ---------------------------------------------------------------------- Doppler - Fetal Vessels  Umbilical Artery   S/D     %tile                                            ADFV    RDFV  2.23       21                                                No      No ---------------------------------------------------------------------- Impression  Severe fetal growth restriction.  AV canal defect confirmed by  fetal echocardiography.  Suspected fetal Down syndrome.  Mild polyhydramnios is seen.  Good  fetal activity is present.  The estimated fetal weight is at the 2nd percentile.  Interval  weight gain is 532 g over 3 weeks.Amniotic fluid is normal  and good fetal activity is seen. Antenatal testing is  reassuring. BPP 8/8. Umbilical artery Doppler showed normal  forward diastolic flow.  BP at our office: 110/69 mm Hg. ---------------------------------------------------------------------- Recommendations  -Continue weekly BPP and Doppler. ----------------------------------------------------------------------                   Tama High, MD Electronically Signed Final Report   12/19/2018 04:40 pm ----------------------------------------------------------------------  [place] MFM UA CORD DOPPLER  Result Date: 12/12/2018 ----------------------------------------------------------------------  OBSTETRICS REPORT                       (Signed Final 12/12/2018 05:26 pm) ---------------------------------------------------------------------- Patient Info  ID #:       [id number]                          D.O.B.:  12/11/1985 (32 yrs)  Name:       Kathryn Barnes               Visit Date: 12/12/2018 03:32 pm ---------------------------------------------------------------------- Performed By  Performed By:     Novella Rob        Ref. Address:     [place]                                                             [place]                                                             [place]  Attending:        Tama High MD        [facility]  Referred By:      [place] ---------------------------------------------------------------------- Orders   #  Description                          Code         Ordered By   1  [place] MFM UA CORD DOPPLER               [id number]     Tama High  ----------------------------------------------------------------------   #  Order #                    Accession #                 Episode #   1  [id number]                  [id number]                  [id number]  ---------------------------------------------------------------------- Indications   Maternal care for known or suspected poor      O36.5931   fetal growth, third trimester, fetus 1 IUGR   Fetal abnormality - other known or             O35.9XX0   suspected (fetal heart defect)   Polyhydramnios, third  trimester, antepartum    O40.3XX0   condition or complication, unspecified fetus   Poor obstetric history: Previous               O09.299   preeclampsia / eclampsia/gestational HTN   (ASA)   Late to prenatal care, third trimester         O09.33   [redacted] weeks gestation of pregnancy                Z3A.30  ---------------------------------------------------------------------- Vital Signs                                                 Height:        5'4" ---------------------------------------------------------------------- Fetal Evaluation  Num Of Fetuses:         1  Cardiac Activity:       Observed  Presentation:           Cephalic  Placenta:               Anterior  P. Cord Insertion:      Previously Visualized  Amniotic Fluid  AFI FV:      Subjectively increased  AFI Sum(cm)     %Tile       Largest Pocket(cm)  24.26           95          7.34  RUQ(cm)       RLQ(cm)       LUQ(cm)        LLQ(cm)  6.6           5.08          7.34           5.24 ---------------------------------------------------------------------- OB History  Gravidity:    2         Term:   1        Prem:   0        SAB:   0  TOP:  0       Ectopic:  0        Living: 1 ---------------------------------------------------------------------- Gestational Age  LMP:           98w 6d        Date:  05/10/18                 EDD:   02/14/19  Best:          [place] Settle 6d     Det. By:  LMP  (05/10/18)          EDD:   02/14/19 ---------------------------------------------------------------------- Anatomy  Stomach:               Appears normal, left   Bladder:                Appears normal                         sided ---------------------------------------------------------------------- Doppler - Fetal Vessels  Umbilical Artery   S/D     %tile   3.4       81 ---------------------------------------------------------------------- Cervix Uterus Adnexa  Cervix  Not visualized (advanced GA >24wks) ----------------------------------------------------------------------  Impression  Mild polyhydramnios is seen.  Good fetal activity seen.  Umbilical artery Doppler showed normal forward diastolic flow.  Complete AV canal defect is seen.  Posterior fossa could not  be assessed clearly today.  I have reassured the patient of the findings (stable). ---------------------------------------------------------------------- Recommendations  -Fetal growth assessment next week  -BPP and UA Doppler from [redacted] weeks gestation till delivery. ----------------------------------------------------------------------                  Tama High, MD Electronically Signed Final Report   12/12/2018 05:26 pm ----------------------------------------------------------------------   Assessment and Plan:  Pregnancy: G2P1001 at [redacted]w[redacted]d 1. Supervision of other normal pregnancy, antepartum Good FM Occ BH ctx  2. Pregnancy affected by fetal growth restriction 01/07/2019 Est. 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 Hx of preeclampsia, prior pregnancy, currently pregnant Taking baby ASA  5. Fetal atrioventricular canal malformation during pregnancy, antepartum, single or unspecified fetus  Preterm labor symptoms and general obstetric precautions including but not limited to vaginal bleeding, contractions, leaking of fluid and fetal movement were reviewed in detail with the patient. I discussed the assessment and treatment plan with the patient. The patient was provided an opportunity to ask questions and all were answered. The patient agreed with the plan and demonstrated an understanding of the instructions. The patient was advised to call back or seek an in-person office evaluation/go to [facility] at [facility] for any urgent or concerning symptoms. Please refer to After Visit Summary for other counseling recommendations.   I provided 10 minutes of face-to-face time during this encounter.  Return in about 2 weeks (around  01/22/2019) for in person.  Future Appointments  Date Time Provider [place]  01/08/2019  2:00 PM Lavonia Drafts, MD [place] None  01/14/2019  3:00 PM [place] NURSE [place] M[facility]-US  01/14/2019  3:00 PM [place] 4 WH-MFCUS MFC-US  01/21/2019  3:45 PM [place] NURSE [place] MFC-US  01/21/2019  3:45 PM [place] 2 WH-MFCUS MFC-US  01/29/2019  3:00 PM [place] NURSE [place] MFC-US  01/29/2019  3:00 PM [place],  MD [facility]

## 2019-01-08 NOTE — Progress Notes (Signed)
S/w patient for virtual visit. Pt reports fetal movement with occasional contractions. Pt states that she cleaned up and cannot find BP cuff at the moment, pt states she will send mychart message with BP reading if she finds it later.

## 2019-01-10 NOTE — L&D Delivery Note (Signed)
Patient is a 34 y.o. now G2P1 s/p NSVD at [redacted]w[redacted]d, who was admitted for IOL for FGR.  Called to patient's room by RN @0602  for delivery - discussed with RN to call NICU for delivery. Baby delivered by RN upon arrival to room- NICU not present at time of delivery Secondary RN reported that she was called to room ~5 minutes prior to CNM being notified of delivery   Cord clamping delayed by several minutes then clamped by CNM and cut by mother of patient. Baby given to NICU for assessment after cord cut. Placenta intact and spontaneous, bleeding minimal. She plans on breastfeeding. She requests POPs for birth control.  Upon later review after delivery - patient reported pressure and involuntary pushing @0548  after pitocin increased without notification to the CNM.   Delivery Note At 6:03 AM a viable and healthy female was delivered via Vaginal, Spontaneous (Presentation: Left Occiput Anterior).  APGAR: 6, 8; weight pending .   Placenta status: Spontaneous, Intact.  Cord: 3 vessels with the following complications: None.  Cord pH: pending  Anesthesia: None Episiotomy: None Lacerations: None Suture Repair: None Est. Blood Loss (mL): 75  Mom to postpartum.  Baby to Couplet care / Skin to Skin.  Lajean Manes CNM 01/25/2019, 7:34 AM

## 2019-01-14 ENCOUNTER — Ambulatory Visit (HOSPITAL_COMMUNITY)
Admission: RE | Admit: 2019-01-14 | Discharge: 2019-01-14 | Disposition: A | Discharge status: Home / Self Care | Payer: Medicaid Other | Source: Ambulatory Visit | Attending: Obstetrics and Gynecology | Admitting: Obstetrics and Gynecology

## 2019-01-14 ENCOUNTER — Encounter (HOSPITAL_COMMUNITY): Payer: Self-pay | Admitting: *Deleted

## 2019-01-14 ENCOUNTER — Other Ambulatory Visit: Payer: Self-pay

## 2019-01-14 ENCOUNTER — Ambulatory Visit (HOSPITAL_COMMUNITY): Payer: Medicaid Other | Admitting: *Deleted

## 2019-01-14 DIAGNOSIS — O359XX0 Maternal care for (suspected) fetal abnormality and damage, unspecified, not applicable or unspecified: Secondary | ICD-10-CM | POA: Diagnosis not present

## 2019-01-14 DIAGNOSIS — Z364 Encounter for antenatal screening for fetal growth retardation: Secondary | ICD-10-CM | POA: Insufficient documentation

## 2019-01-14 DIAGNOSIS — Z3480 Encounter for supervision of other normal pregnancy, unspecified trimester: Secondary | ICD-10-CM | POA: Insufficient documentation

## 2019-01-14 DIAGNOSIS — Z3A35 35 weeks gestation of pregnancy: Secondary | ICD-10-CM

## 2019-01-14 DIAGNOSIS — O403XX0 Polyhydramnios, third trimester, not applicable or unspecified: Secondary | ICD-10-CM | POA: Diagnosis not present

## 2019-01-14 DIAGNOSIS — O365930 Maternal care for other known or suspected poor fetal growth, third trimester, not applicable or unspecified: Secondary | ICD-10-CM

## 2019-01-14 DIAGNOSIS — O09293 Supervision of pregnancy with other poor reproductive or obstetric history, third trimester: Secondary | ICD-10-CM | POA: Diagnosis not present

## 2019-01-14 DIAGNOSIS — O0933 Supervision of pregnancy with insufficient antenatal care, third trimester: Secondary | ICD-10-CM | POA: Diagnosis not present

## 2019-01-21 ENCOUNTER — Other Ambulatory Visit: Payer: Self-pay

## 2019-01-21 ENCOUNTER — Other Ambulatory Visit (HOSPITAL_COMMUNITY): Payer: Self-pay | Admitting: Obstetrics and Gynecology

## 2019-01-21 ENCOUNTER — Ambulatory Visit (HOSPITAL_COMMUNITY)
Admission: RE | Admit: 2019-01-21 | Discharge: 2019-01-21 | Disposition: A | Discharge status: Home / Self Care | Payer: Medicaid Other | Source: Ambulatory Visit | Attending: Obstetrics and Gynecology | Admitting: Obstetrics and Gynecology

## 2019-01-21 ENCOUNTER — Ambulatory Visit (HOSPITAL_COMMUNITY): Payer: Medicaid Other | Admitting: *Deleted

## 2019-01-21 DIAGNOSIS — O359XX0 Maternal care for (suspected) fetal abnormality and damage, unspecified, not applicable or unspecified: Secondary | ICD-10-CM

## 2019-01-21 DIAGNOSIS — O0933 Supervision of pregnancy with insufficient antenatal care, third trimester: Secondary | ICD-10-CM | POA: Diagnosis not present

## 2019-01-21 DIAGNOSIS — O1493 Unspecified pre-eclampsia, third trimester: Secondary | ICD-10-CM

## 2019-01-21 DIAGNOSIS — Z362 Encounter for other antenatal screening follow-up: Secondary | ICD-10-CM

## 2019-01-21 DIAGNOSIS — O365930 Maternal care for other known or suspected poor fetal growth, third trimester, not applicable or unspecified: Secondary | ICD-10-CM | POA: Diagnosis not present

## 2019-01-21 DIAGNOSIS — Z3A33 33 weeks gestation of pregnancy: Secondary | ICD-10-CM

## 2019-01-21 DIAGNOSIS — O09293 Supervision of pregnancy with other poor reproductive or obstetric history, third trimester: Secondary | ICD-10-CM | POA: Diagnosis not present

## 2019-01-21 DIAGNOSIS — Z3480 Encounter for supervision of other normal pregnancy, unspecified trimester: Secondary | ICD-10-CM

## 2019-01-21 DIAGNOSIS — O26873 Cervical shortening, third trimester: Secondary | ICD-10-CM

## 2019-01-21 DIAGNOSIS — Z364 Encounter for antenatal screening for fetal growth retardation: Secondary | ICD-10-CM

## 2019-01-21 DIAGNOSIS — O403XX0 Polyhydramnios, third trimester, not applicable or unspecified: Secondary | ICD-10-CM

## 2019-01-21 DIAGNOSIS — Z3A36 36 weeks gestation of pregnancy: Secondary | ICD-10-CM

## 2019-01-22 ENCOUNTER — Ambulatory Visit (INDEPENDENT_AMBULATORY_CARE_PROVIDER_SITE_OTHER): Payer: Medicaid Other

## 2019-01-22 ENCOUNTER — Other Ambulatory Visit (HOSPITAL_COMMUNITY)
Admission: RE | Admit: 2019-01-22 | Discharge: 2019-01-22 | Disposition: A | Discharge status: Home / Self Care | Payer: Medicaid Other | Source: Ambulatory Visit

## 2019-01-22 ENCOUNTER — Other Ambulatory Visit: Payer: Self-pay | Admitting: Advanced Practice Midwife

## 2019-01-22 VITALS — BP 110/68 | HR 79

## 2019-01-22 DIAGNOSIS — Z3480 Encounter for supervision of other normal pregnancy, unspecified trimester: Secondary | ICD-10-CM

## 2019-01-22 DIAGNOSIS — O0990 Supervision of high risk pregnancy, unspecified, unspecified trimester: Secondary | ICD-10-CM

## 2019-01-22 DIAGNOSIS — O365990 Maternal care for other known or suspected poor fetal growth, unspecified trimester, not applicable or unspecified: Secondary | ICD-10-CM

## 2019-01-22 DIAGNOSIS — Z3A36 36 weeks gestation of pregnancy: Secondary | ICD-10-CM

## 2019-01-22 DIAGNOSIS — O0993 Supervision of high risk pregnancy, unspecified, third trimester: Secondary | ICD-10-CM

## 2019-01-22 DIAGNOSIS — O365930 Maternal care for other known or suspected poor fetal growth, third trimester, not applicable or unspecified: Secondary | ICD-10-CM

## 2019-01-22 NOTE — Patient Instructions (Signed)

## 2019-01-22 NOTE — Progress Notes (Signed)
PRENATAL VISIT NOTE  Subjective:  Kathryn Barnes is a 34 y.o. G2P1001 at [redacted]w[redacted]d who presents today for routine prenatal care.  She is currently being monitored for supervision of a high-risk pregnancy with problems as listed below.  Patient questions what the plan for induction will be.  She endorses fetal movement and denies contractions.  She denies vaginal concerns including abnormal discharge, bleeding, leaking, itching, and burning. Patient requests that vaginal exam be deferred today.   Patient Active Problem List   Diagnosis Date Noted  . Pregnancy affected by fetal growth restriction 11/27/2018  . 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 Supervision of other normal pregnancy, antepartum 10/30/2018  . 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 Hx of preeclampsia, prior pregnancy, currently pregnant 03/08/2017    The following portions of the patient's history were reviewed and updated as appropriate: allergies, current medications, past family history, past medical history, past social history, past surgical history and problem list. Problem list updated.  Objective:   Vitals:   01/22/19 1452  BP: 110/68  Pulse: 79    Fetal Status: Fetal Heart Rate (bpm): 140 Fundal Height: 34 cm Movement: Present     General:  Alert, oriented and cooperative. Patient is in no acute distress.  Skin: Skin is warm and dry.   Cardiovascular: Regular rate and rhythm.  Respiratory: Normal respiratory effort. CTA-Bilaterally  Abdomen: Soft, gravid, appropriate for gestational age.  Pelvic: Cervical exam deferred        Extremities: Normal range of motion.  Edema: None  Mental Status: Normal mood and affect. Normal behavior. Normal judgment and thought content.  [place] MFM FETAL BPP WO NON STRESS  Result Date: 01/21/2019 ----------------------------------------------------------------------  OBSTETRICS REPORT                        (Signed Final 01/21/2019 04:51 pm) ---------------------------------------------------------------------- Patient Info  ID #:       [id number]                          D.O.B.:  07-Mar-1985 (33 yrs)  Name:       Kathryn Barnes               Visit Date: 01/21/2019 04:04 pm ---------------------------------------------------------------------- Performed By  Performed By:     Vianne Bulls Tester BS,       Ref. Address:     [address] [place]  [place]                                                             [place]  Attending:        Tama High MD        [facility]  Referred By:      [facility] ---------------------------------------------------------------------- Orders   #  Description                          Code         Ordered By   1  [place] MFM FETAL BPP WO NON              76819.01     RAVI SHANKAR      STRESS   2  [place] MFM UA CORD DOPPLER               76820.02     RAVI SHANKAR   3  [place] MFM OB FOLLOW UP                  76816.01     RAVI [facility]  ----------------------------------------------------------------------   #  Order #                    Accession #                 Episode #   1  [id number]                  [id number]                  [id number]   2  [id number]                  [id number]                  [id number]   3  [id number]                  [id number]                  [id number]  ---------------------------------------------------------------------- Indications   [redacted] weeks gestation of pregnancy                Z3A.36   Maternal care for known or suspected poor      O36.5930   fetal growth, third trimester, not applicable or   unspecified IUGR   Fetal abnormality - other known or              O35.9XX0   suspected (fetal heart defect)   Polyhydramnios, third trimester, antepartum    O40.3XX0   condition or complication, unspecified fetus   Poor obstetric history: Previous               O09.299  preeclampsia / eclampsia/gestational HTN   (ASA)   Late to prenatal care, third trimester         O09.33  ---------------------------------------------------------------------- Vital Signs                                                 Height:        5'4" ---------------------------------------------------------------------- Fetal Evaluation  Num Of Fetuses:         1  Fetal Heart Rate(bpm):  134  Cardiac Activity:       Observed  Presentation:           Cephalic  Placenta:               Anterior  P. Cord Insertion:      Previously Visualized  Amniotic Fluid  AFI FV:      Within normal limits  AFI Sum(cm)     %Tile       Largest Pocket(cm)  22.71           87          7.74  RUQ(cm)       RLQ(cm)       LUQ(cm)        LLQ(cm)  7.74          4.14          6.81           4.02 ---------------------------------------------------------------------- Biophysical Evaluation  Amniotic F.V:   Pocket => 2 cm             F. Tone:        Observed  F. Movement:    Observed                   Score:          8/8  F. Breathing:   Observed ---------------------------------------------------------------------- Biometry  BPD:      87.8  mm     G. Age:  35w 3d         31  %    CI:        82.96   %    70 - 86                                                          FL/HC:      19.1   %    20.8 - 22.6  HC:       304   mm     G. Age:  33w 6d        < 1  %    HC/AC:      1.01        0.92 - 1.05  AC:      302.2  mm     G. Age:  34w 1d          7  %    FL/BPD:     66.3   %    71 - 87  FL:       58.2  mm     G. Age:  30w 3d        < 1  %  FL/AC:      19.3   %    20 - 24  HUM:      50.6  mm     G. Age:  29w 5d        < 5  %  CER:      51.1  mm     G. Age:  N/A          > 95  %  LV:        3.4  mm  Est. FW:    2143  gm    4 lb 12 oz       2   % ---------------------------------------------------------------------- OB History  Gravidity:    2         Term:   1        Prem:   0        SAB:   0  TOP:          0       Ectopic:  0        Living: 1 ---------------------------------------------------------------------- Gestational Age  LMP:           36w 4d        Date:  05/10/18                 EDD:   02/14/19  U/S Today:     33w 3d                                        EDD:   03/08/19  Best:          36w 4d     Det. By:  LMP  (05/10/18)          EDD:   02/14/19 ---------------------------------------------------------------------- Anatomy  Cranium:               Appears normal         Aortic Arch:            Not well visualized  Cavum:                 Previously seen        Ductal Arch:            Not well visualized  Ventricles:            Appears normal         Diaphragm:              Appears normal  Choroid Plexus:        Previously seen        Stomach:                Appears normal, left                                                                        sided  Cerebellum:            Abnormal, see          Abdomen:                Previously  seen                         comments  Nuchal Fold:           Not applicable (Q000111Q    Abdominal Wall:         Previously seen                         wks GA)  Face:                  Absent nasal bone      Cord Vessels:           Previously seen                         prev vis  Lips:                  Previously seen        Kidneys:                Appear normal  Thoracic:              Appears normal         Bladder:                Appears normal  Heart:                 Abnormal, see          Spine:                  Limited views prev                         comments                                                                        visualized  RVOT:                  Not well visualized    Upper Extremities:      Previously seen  LVOT:                  Not well visualized    Lower Extremities:      Previously seen   Other:  Technically difficult due to fetal position. ---------------------------------------------------------------------- Doppler - Fetal Vessels  Umbilical Artery   S/D     %tile     RI                                     ADFV    RDFV  3.45       95   0.71                                         No      No ---------------------------------------------------------------------- Cervix Uterus Adnexa  Cervix  Not visualized (advanced GA >24wks)  Uterus  No abnormality visualized.  Left Ovary  Within normal limits. Normal, measuring  Right Ovary  Within normal limits. No adnexal mass visualized.  Cul De Sac  No free fluid seen.  Adnexa  No abnormality visualized. ---------------------------------------------------------------------- Impression  Patient with severe fetal growth restriction return for  ultrasound assessment.  Fetal cardiac anomaly (AV canal  clear defect) was confirmed by fetal echocardiography.  Posterior fossa abnormality was also suspected on  ultrasound.  On today's ultrasound, amniotic fluid is normal good fetal  activity seen.  Cephalic presentation.  The e way she wants  the estimated fetal weight is at the 2nd percentile.  Antenatal  testing is reassuring.  BPP 8/8.  Umbilical artery Doppler  showed normal forward diastolic flow. ---------------------------------------------------------------------- Recommendations  -Delivery at 37 weeks.  -Postnatal head ultrasound to rule out intracranial anomalies. ----------------------------------------------------------------------                  Tama High, MD Electronically Signed Final Report   01/21/2019 04:51 pm ----------------------------------------------------------------------  [place] MFM OB FOLLOW UP  Result Date: 01/21/2019 ----------------------------------------------------------------------  OBSTETRICS REPORT                       (Signed Final 01/21/2019 04:51 pm) ----------------------------------------------------------------------  Patient Info  ID #:       [id number]                          D.O.B.:  1985-10-19 (33 yrs)  Name:       Kathryn Barnes               Visit Date: 01/21/2019 04:04 pm ---------------------------------------------------------------------- Performed By  Performed By:     Vianne Bulls Tester BS,       Ref. Address:     [address] [place]                                                             [place]                                                             [place]  Attending:        Tama High MD  Location[facility]  Referred By:      [facility] ---------------------------------------------------------------------- Orders   #  Description                          Code         Ordered By   1  [place] MFM FETAL BPP WO NON              76819.01     [place]   2  [place] MFM UA CORD DOPPLER               76820.02     RAVI SHANKAR   3  [place] MFM OB FOLLOW UP                  76816.01     RAVI [facility]  ----------------------------------------------------------------------   #  Order #                    Accession #                 Episode #   1  [id number]                  [id number]                  [id number]   2  [id number]                  [id number]                  [id number]   3  [id number]                  [id number]                  [id number]  ---------------------------------------------------------------------- Indications   [redacted] weeks gestation of pregnancy                Z3A.36   Maternal care for known or suspected poor      O36.5930   fetal growth, third trimester, not applicable or   unspecified IUGR   Fetal abnormality - other known or             O35.9XX0   suspected (fetal heart defect)   Polyhydramnios, third trimester, antepartum    O40.3XX0   condition  or complication, unspecified fetus   Poor obstetric history: Previous               O09.299   preeclampsia / eclampsia/gestational HTN   (ASA)   Late to prenatal care, third trimester         O09.33  ---------------------------------------------------------------------- Vital Signs                                                 Height:  5'4" ---------------------------------------------------------------------- Fetal Evaluation  Num Of Fetuses:         1  Fetal Heart Rate(bpm):  134  Cardiac Activity:       Observed  Presentation:           Cephalic  Placenta:               Anterior  P. Cord Insertion:      Previously Visualized  Amniotic Fluid  AFI FV:      Within normal limits  AFI Sum(cm)     %Tile       Largest Pocket(cm)  22.71           87          7.74  RUQ(cm)       RLQ(cm)       LUQ(cm)        LLQ(cm)  7.74          4.14          6.81           4.02 ---------------------------------------------------------------------- Biophysical Evaluation  Amniotic F.V:   Pocket => 2 cm             F. Tone:        Observed  F. Movement:    Observed                   Score:          8/8  F. Breathing:   Observed ---------------------------------------------------------------------- Biometry  BPD:      87.8  mm     G. Age:  35w 3d         31  %    CI:        82.96   %    70 - 86                                                          FL/HC:      19.1   %    20.8 - 22.6  HC:       304   mm     G. Age:  33w 6d        < 1  %    HC/AC:      1.01        0.92 - 1.05  AC:      302.2  mm     G. Age:  34w 1d          7  %    FL/BPD:     66.3   %    71 - 87  FL:       58.2  mm     G. Age:  30w 3d        < 1  %    FL/AC:      19.3   %    20 - 24  HUM:      50.6  mm     G. Age:  29w 5d        < 5  %  CER:      51.1  mm     G. Age:  N/A          > 95  %  LV:  3.4  mm  Est. FW:    2143  gm    4 lb 12 oz       2  % ---------------------------------------------------------------------- OB History  Gravidity:    2          Term:   1        Prem:   0        SAB:   0  TOP:          0       Ectopic:  0        Living: 1 ---------------------------------------------------------------------- Gestational Age  LMP:           36w 4d        Date:  05/10/18                 EDD:   02/14/19  U/S Today:     33w 3d                                        EDD:   03/08/19  Best:          36w 4d     Det. By:  LMP  (05/10/18)          EDD:   02/14/19 ---------------------------------------------------------------------- Anatomy  Cranium:               Appears normal         Aortic Arch:            Not well visualized  Cavum:                 Previously seen        Ductal Arch:            Not well visualized  Ventricles:            Appears normal         Diaphragm:              Appears normal  Choroid Plexus:        Previously seen        Stomach:                Appears normal, left                                                                        sided  Cerebellum:            Abnormal, see          Abdomen:                Previously seen                         comments  Nuchal Fold:           Not applicable (Q000111Q    Abdominal Wall:         Previously seen                         wks GA)  Face:  Absent nasal bone      Cord Vessels:           Previously seen                         prev vis  Lips:                  Previously seen        Kidneys:                Appear normal  Thoracic:              Appears normal         Bladder:                Appears normal  Heart:                 Abnormal, see          Spine:                  Limited views prev                         comments                                                                        visualized  RVOT:                  Not well visualized    Upper Extremities:      Previously seen  LVOT:                  Not well visualized    Lower Extremities:      Previously seen  Other:  Technically difficult due to fetal position.  ---------------------------------------------------------------------- Doppler - Fetal Vessels  Umbilical Artery   S/D     %tile     RI                                     ADFV    RDFV  3.45       95   0.71                                         No      No ---------------------------------------------------------------------- Cervix Uterus Adnexa  Cervix  Not visualized (advanced GA >24wks)  Uterus  No abnormality visualized.  Left Ovary  Within normal limits. Normal, measuring  Right Ovary  Within normal limits. No adnexal mass visualized.  Cul De Sac  No free fluid seen.  Adnexa  No abnormality visualized. ---------------------------------------------------------------------- Impression  Patient with severe fetal growth restriction return for  ultrasound assessment.  Fetal cardiac anomaly (AV canal  clear defect) was confirmed by fetal echocardiography.  Posterior fossa abnormality was also suspected on  ultrasound.  On today's ultrasound, amniotic fluid is normal good fetal  activity seen.  Cephalic presentation.  The e way she wants  the estimated fetal  weight is at the 2nd percentile.  Antenatal  testing is reassuring.  BPP 8/8.  Umbilical artery Doppler  showed normal forward diastolic flow. ---------------------------------------------------------------------- Recommendations  -Delivery at 37 weeks.  -Postnatal head ultrasound to rule out intracranial anomalies. ----------------------------------------------------------------------                  Tama High, MD Electronically Signed Final Report   01/21/2019 04:51 pm ----------------------------------------------------------------------  [place] MFM UA CORD DOPPLER  Result Date: 01/21/2019 ----------------------------------------------------------------------  OBSTETRICS REPORT                       (Signed Final 01/21/2019 04:51 pm) ---------------------------------------------------------------------- Patient Info  ID #:       [id number]                           D.O.B.:  12-Dec-1985 (33 yrs)  Name:       Kathryn Barnes               Visit Date: 01/21/2019 04:04 pm ---------------------------------------------------------------------- Performed By  Performed By:     Vianne Bulls Tester BS,       Ref. Address:     [address], [place]                                                             [place]  Attending:        Tama High MD        Location[facility]  Referred By:      [place] ---------------------------------------------------------------------- Orders   #  Description  Code         Ordered By   1  [place] MFM FETAL BPP WO NON              [id number]     RAVI SHANKAR      STRESS   2  [place] MFM UA CORD DOPPLER               76820.02     RAVI SHANKAR   3  [place] MFM OB FOLLOW UP                  76816.01     RAVI [facility]  ----------------------------------------------------------------------   #  Order #                    Accession #                 Episode #   1  [id number]                  [id number]                  [id number]   2  [id number]                  [id number]                  [id number]:[id number]   3  [id number]                  [id number]                  [id number]  ---------------------------------------------------------------------- Indications   [redacted] weeks gestation of pregnancy                Z3A.36   Maternal care for known or suspected poor      O36.5930   fetal growth, third trimester, not applicable or   unspecified IUGR   Fetal abnormality - other known or             O35.9XX0   suspected (fetal heart defect)   Polyhydramnios, third trimester, antepartum    O40.3XX0   condition or complication, unspecified fetus   Poor obstetric  history: Previous               O09.299   preeclampsia / eclampsia/gestational HTN   (ASA)   Late to prenatal care, third trimester         O09.33  ---------------------------------------------------------------------- Vital Signs                                                 Height:        5'4" ---------------------------------------------------------------------- Fetal Evaluation  Num Of Fetuses:         1  Fetal Heart Rate(bpm):  134  Cardiac Activity:       Observed  Presentation:           Cephalic  Placenta:               Anterior  P. Cord Insertion:      Previously Visualized  Amniotic Fluid  AFI FV:      Within normal limits  AFI Sum(cm)     %Tile       Largest Pocket(cm)  22.71  87          7.74  RUQ(cm)       RLQ(cm)       LUQ(cm)        LLQ(cm)  7.74          4.14          6.81           4.02 ---------------------------------------------------------------------- Biophysical Evaluation  Amniotic F.V:   Pocket => 2 cm             F. Tone:        Observed  F. Movement:    Observed                   Score:          8/8  F. Breathing:   Observed ---------------------------------------------------------------------- Biometry  BPD:      87.8  mm     G. Age:  35w 3d         31  %    CI:        82.96   %    70 - 86                                                          FL/HC:      19.1   %    20.8 - 22.6  HC:       304   mm     G. Age:  33w 6d        < 1  %    HC/AC:      1.01        0.92 - 1.05  AC:      302.2  mm     G. Age:  34w 1d          7  %    FL/BPD:     66.3   %    71 - 87  FL:       58.2  mm     G. Age:  30w 3d        < 1  %    FL/AC:      19.3   %    20 - 24  HUM:      50.6  mm     G. Age:  29w 5d        < 5  %  CER:      51.1  mm     G. Age:  N/A          > 95  %  LV:        3.4  mm  Est. FW:    2143  gm    4 lb 12 oz       2  % ---------------------------------------------------------------------- OB History  Gravidity:    2         Term:   1        Prem:   0        SAB:   0  TOP:           0       Ectopic:  0        Living: 1 ---------------------------------------------------------------------- Gestational Age  LMP:  36w 4d        Date:  05/10/18                 EDD:   02/14/19  U/S Today:     33w 3d                                        EDD:   03/08/19  Best:          36w 4d     Det. By:  LMP  (05/10/18)          EDD:   02/14/19 ---------------------------------------------------------------------- Anatomy  Cranium:               Appears normal         Aortic Arch:            Not well visualized  Cavum:                 Previously seen        Ductal Arch:            Not well visualized  Ventricles:            Appears normal         Diaphragm:              Appears normal  Choroid Plexus:        Previously seen        Stomach:                Appears normal, left                                                                        sided  Cerebellum:            Abnormal, see          Abdomen:                Previously seen                         comments  Nuchal Fold:           Not applicable (Q000111Q    Abdominal Wall:         Previously seen                         wks GA)  Face:                  Absent nasal bone      Cord Vessels:           Previously seen                         prev vis  Lips:                  Previously seen        Kidneys:                Appear normal  Thoracic:  Appears normal         Bladder:                Appears normal  Heart:                 Abnormal, see          Spine:                  Limited views prev                         comments                                                                        visualized  RVOT:                  Not well visualized    Upper Extremities:      Previously seen  LVOT:                  Not well visualized    Lower Extremities:      Previously seen  Other:  Technically difficult due to fetal position. ---------------------------------------------------------------------- Doppler - Fetal Vessels  Umbilical  Artery   S/D     %tile     RI                                     ADFV    RDFV  3.45       95   0.71                                         No      No ---------------------------------------------------------------------- Cervix Uterus Adnexa  Cervix  Not visualized (advanced GA >24wks)  Uterus  No abnormality visualized.  Left Ovary  Within normal limits. Normal, measuring  Right Ovary  Within normal limits. No adnexal mass visualized.  Cul De Sac  No free fluid seen.  Adnexa  No abnormality visualized. ---------------------------------------------------------------------- Impression  Patient with severe fetal growth restriction return for  ultrasound assessment.  Fetal cardiac anomaly (AV canal  clear defect) was confirmed by fetal echocardiography.  Posterior fossa abnormality was also suspected on  ultrasound.  On today's ultrasound, amniotic fluid is normal good fetal  activity seen.  Cephalic presentation.  The e way she wants  the estimated fetal weight is at the 2nd percentile.  Antenatal  testing is reassuring.  BPP 8/8.  Umbilical artery Doppler  showed normal forward diastolic flow. ---------------------------------------------------------------------- Recommendations  -Delivery at 37 weeks.  -Postnatal head ultrasound to rule out intracranial anomalies. ----------------------------------------------------------------------                  Tama High, MD Electronically Signed Final Report   01/21/2019 04:51 pm ----------------------------------------------------------------------    Assessment and Plan:  Pregnancy: G2P1001 at [redacted]w[redacted]d  1.Supervision of high risk pregnancy, antepartum -Ultrasound reviewed and discussed MFM recommendation as above -Will collect cultures today as below. -Informed that GBS may need  to be repeated if not resulted prior to induction. -Plan to RTO in 4-5 weeks for PP visit.  - GBS - GC/Chlam ([place])  3. Pregnancy affected by fetal growth restriction -IOL  scheduled for Saturday [facility]th [facility] -Discussed r/b of induction including fetal distress, serial induction, pain, and increased risk of c/s delivery -Discussed induction methods including cervical ripening agents, foley bulbs, and pitocin -Patient given opportunity to ask questions, which were addressed as appropriate. -Patient informed that induction method would not be decided until VE performed.   Term labor symptoms and general obstetric precautions including but not limited to vaginal bleeding, contractions, leaking of fluid and fetal movement were reviewed with the patient.  Please refer to After Visit Summary for other counseling recommendations.  Return in about 4 weeks (around 02/19/2019) for Postpartum.  Future Appointments  Date Time Provider [place]  01/25/2019 12:00 AM [facility] SCHED ROOM [facility]-INDC None  01/29/2019  3:00 PM [place] NURSE [place] [facility]  01/29/2019  3:00 PM [place], CNM 01/22/2019, 3:12 PM

## 2019-01-22 NOTE — Progress Notes (Signed)
ROB   Pt declines cervix check   CC: none want to discuss induction

## 2019-01-23 LAB — GC/CHLAMYDIA PROBE AMP (~~LOC~~) NOT AT ARMC
Chlamydia: NEGATIVE
Comment: NEGATIVE
Comment: NORMAL
Neisseria Gonorrhea: NEGATIVE

## 2019-01-24 LAB — STREP GP B NAA: Strep Gp B NAA: NEGATIVE

## 2019-01-25 ENCOUNTER — Inpatient Hospital Stay (HOSPITAL_COMMUNITY)
Admission: AD | Admit: 2019-01-25 | Discharge: 2019-01-27 | DRG: 807 | Disposition: A | Discharge status: Home / Self Care | Payer: Medicaid Other | Attending: Obstetrics and Gynecology | Admitting: Obstetrics and Gynecology

## 2019-01-25 ENCOUNTER — Other Ambulatory Visit: Payer: Self-pay

## 2019-01-25 ENCOUNTER — Inpatient Hospital Stay (HOSPITAL_COMMUNITY): Payer: Medicaid Other

## 2019-01-25 ENCOUNTER — Encounter (HOSPITAL_COMMUNITY): Payer: Self-pay | Admitting: Obstetrics and Gynecology

## 2019-01-25 DIAGNOSIS — O365930 Maternal care for other known or suspected poor fetal growth, third trimester, not applicable or unspecified: Secondary | ICD-10-CM | POA: Diagnosis not present

## 2019-01-25 DIAGNOSIS — O358XX0 Maternal care for other (suspected) fetal abnormality and damage, not applicable or unspecified: Secondary | ICD-10-CM | POA: Diagnosis present

## 2019-01-25 DIAGNOSIS — O403XX0 Polyhydramnios, third trimester, not applicable or unspecified: Secondary | ICD-10-CM | POA: Diagnosis present

## 2019-01-25 DIAGNOSIS — O411290 Chorioamnionitis, unspecified trimester, not applicable or unspecified: Secondary | ICD-10-CM | POA: Diagnosis not present

## 2019-01-25 DIAGNOSIS — Z20822 Contact with and (suspected) exposure to covid-19: Secondary | ICD-10-CM | POA: Diagnosis present

## 2019-01-25 DIAGNOSIS — Z3480 Encounter for supervision of other normal pregnancy, unspecified trimester: Secondary | ICD-10-CM

## 2019-01-25 DIAGNOSIS — Z3A37 37 weeks gestation of pregnancy: Secondary | ICD-10-CM | POA: Diagnosis not present

## 2019-01-25 DIAGNOSIS — Z3A00 Weeks of gestation of pregnancy not specified: Secondary | ICD-10-CM | POA: Diagnosis not present

## 2019-01-25 DIAGNOSIS — O09299 Supervision of pregnancy with other poor reproductive or obstetric history, unspecified trimester: Secondary | ICD-10-CM

## 2019-01-25 DIAGNOSIS — O365990 Maternal care for other known or suspected poor fetal growth, unspecified trimester, not applicable or unspecified: Secondary | ICD-10-CM | POA: Diagnosis present

## 2019-01-25 DIAGNOSIS — O35BXX0 Maternal care for other (suspected) fetal abnormality and damage, fetal cardiac anomalies, not applicable or unspecified: Secondary | ICD-10-CM | POA: Diagnosis present

## 2019-01-25 HISTORY — DX: Polyhydramnios, third trimester, not applicable or unspecified: O40.3XX0

## 2019-01-25 LAB — RPR: RPR Ser Ql: NONREACTIVE

## 2019-01-25 LAB — CBC
HCT: 35.8 % — ABNORMAL LOW (ref 36.0–46.0)
Hemoglobin: 11.8 g/dL — ABNORMAL LOW (ref 12.0–15.0)
MCH: 28.2 pg (ref 26.0–34.0)
MCHC: 33 g/dL (ref 30.0–36.0)
MCV: 85.6 fL (ref 80.0–100.0)
Platelets: 257 10*3/uL (ref 150–400)
RBC: 4.18 MIL/uL (ref 3.87–5.11)
RDW: 13.6 % (ref 11.5–15.5)
WBC: 6.6 10*3/uL (ref 4.0–10.5)
nRBC: 0 % (ref 0.0–0.2)

## 2019-01-25 LAB — TYPE AND SCREEN
ABO/RH(D): O POS
Antibody Screen: NEGATIVE

## 2019-01-25 LAB — ABO/RH: ABO/RH(D): O POS

## 2019-01-25 LAB — SARS CORONAVIRUS 2 (TAT 6-24 HRS): SARS Coronavirus 2: NEGATIVE

## 2019-01-25 MED ORDER — LIDOCAINE HCL (PF) 1 % IJ SOLN: 30.0000 mL | INTRAMUSCULAR | Status: DC | PRN

## 2019-01-25 MED ORDER — COCONUT OIL OIL: 1.0000 "application " | TOPICAL_OIL | Status: DC | PRN

## 2019-01-25 MED ORDER — OXYTOCIN BOLUS FROM INFUSION
500.0000 mL | Freq: Once | INTRAVENOUS | Status: DC
  Administered 2019-01-25: 500 mL via INTRAVENOUS

## 2019-01-25 MED ORDER — ONDANSETRON HCL 4 MG/2ML IJ SOLN: 4.0000 mg | INTRAMUSCULAR | Status: DC | PRN

## 2019-01-25 MED ORDER — ZOLPIDEM TARTRATE 5 MG PO TABS: 5.0000 mg | ORAL_TABLET | Freq: Every evening | ORAL | Status: DC | PRN

## 2019-01-25 MED ORDER — ONDANSETRON HCL 4 MG/2ML IJ SOLN: 4.0000 mg | Freq: Four times a day (QID) | INTRAMUSCULAR | Status: DC | PRN

## 2019-01-25 MED ORDER — SENNOSIDES-DOCUSATE SODIUM 8.6-50 MG PO TABS
2.0000 | ORAL_TABLET | ORAL | Status: DC
  Administered 2019-01-26 (×2): 2 via ORAL
  Filled 2019-01-25 (×2): qty 2

## 2019-01-25 MED ORDER — MISOPROSTOL 25 MCG QUARTER TABLET
25.0000 ug | ORAL_TABLET | ORAL | Status: DC | PRN
  Administered 2019-01-25: 25 ug via VAGINAL
  Filled 2019-01-25: qty 1

## 2019-01-25 MED ORDER — OXYTOCIN 40 UNITS IN NORMAL SALINE INFUSION - SIMPLE MED: 2.5000 [IU]/h | INTRAVENOUS | Status: DC

## 2019-01-25 MED ORDER — DIBUCAINE (PERIANAL) 1 % EX OINT: 1.0000 "application " | TOPICAL_OINTMENT | CUTANEOUS | Status: DC | PRN

## 2019-01-25 MED ORDER — TETANUS-DIPHTH-ACELL PERTUSSIS 5-2.5-18.5 LF-MCG/0.5 IM SUSP: 0.5000 mL | Freq: Once | INTRAMUSCULAR | Status: DC

## 2019-01-25 MED ORDER — LACTATED RINGERS IV SOLN: 500.0000 mL | INTRAVENOUS | Status: DC | PRN

## 2019-01-25 MED ORDER — OXYCODONE-ACETAMINOPHEN 5-325 MG PO TABS: 2.0000 | ORAL_TABLET | ORAL | Status: DC | PRN

## 2019-01-25 MED ORDER — BENZOCAINE-MENTHOL 20-0.5 % EX AERO
1.0000 "application " | INHALATION_SPRAY | CUTANEOUS | Status: DC | PRN
  Administered 2019-01-27: 1 via TOPICAL
  Filled 2019-01-25: qty 56

## 2019-01-25 MED ORDER — OXYTOCIN 40 UNITS IN NORMAL SALINE INFUSION - SIMPLE MED
1.0000 m[IU]/min | INTRAVENOUS | Status: DC
  Administered 2019-01-25: 2 m[IU]/min via INTRAVENOUS
  Filled 2019-01-25: qty 1000

## 2019-01-25 MED ORDER — OXYCODONE-ACETAMINOPHEN 5-325 MG PO TABS: 1.0000 | ORAL_TABLET | ORAL | Status: DC | PRN

## 2019-01-25 MED ORDER — LACTATED RINGERS IV SOLN: INTRAVENOUS | Status: DC

## 2019-01-25 MED ORDER — IBUPROFEN 600 MG PO TABS
600.0000 mg | ORAL_TABLET | Freq: Four times a day (QID) | ORAL | Status: DC
  Administered 2019-01-25 – 2019-01-27 (×8): 600 mg via ORAL
  Filled 2019-01-25 (×9): qty 1

## 2019-01-25 MED ORDER — TERBUTALINE SULFATE 1 MG/ML IJ SOLN: 0.2500 mg | Freq: Once | INTRAMUSCULAR | Status: DC | PRN

## 2019-01-25 MED ORDER — ACETAMINOPHEN 325 MG PO TABS: 650.0000 mg | ORAL_TABLET | ORAL | Status: DC | PRN

## 2019-01-25 MED ORDER — ONDANSETRON HCL 4 MG PO TABS: 4.0000 mg | ORAL_TABLET | ORAL | Status: DC | PRN

## 2019-01-25 MED ORDER — FENTANYL CITRATE (PF) 100 MCG/2ML IJ SOLN: 100.0000 ug | INTRAMUSCULAR | Status: DC | PRN

## 2019-01-25 MED ORDER — WITCH HAZEL-GLYCERIN EX PADS: 1.0000 "application " | MEDICATED_PAD | CUTANEOUS | Status: DC | PRN

## 2019-01-25 MED ORDER — PRENATAL MULTIVITAMIN CH
1.0000 | ORAL_TABLET | Freq: Every day | ORAL | Status: DC
  Administered 2019-01-25 – 2019-01-26 (×2): 1 via ORAL
  Filled 2019-01-25 (×2): qty 1

## 2019-01-25 MED ORDER — DIPHENHYDRAMINE HCL 25 MG PO CAPS: 25.0000 mg | ORAL_CAPSULE | Freq: Four times a day (QID) | ORAL | Status: DC | PRN

## 2019-01-25 MED ORDER — SOD CITRATE-CITRIC ACID 500-334 MG/5ML PO SOLN: 30.0000 mL | ORAL | Status: DC | PRN

## 2019-01-25 MED ORDER — SIMETHICONE 80 MG PO CHEW: 80.0000 mg | CHEWABLE_TABLET | ORAL | Status: DC | PRN

## 2019-01-25 MED ORDER — ACETAMINOPHEN 325 MG PO TABS
650.0000 mg | ORAL_TABLET | ORAL | Status: DC | PRN
  Administered 2019-01-25: 650 mg via ORAL
  Filled 2019-01-25: qty 2

## 2019-01-25 NOTE — H&P (Signed)
LABOR AND DELIVERY ADMISSION HISTORY AND PHYSICAL NOTE  Kathryn Barnes is a 34 y.o. female G2P1001 with IUP at [redacted]w[redacted]d by LMP presenting for IOL for FGR, possible trisomy 21, absent nasal bone and cardiac defect complicating pregnancy  She reports positive fetal movement. She denies leakage of fluid or vaginal bleeding.  Prenatal History/Complications: PNC at [facility] Pregnancy complications:  - FGR - Mild polyhydramnios  - Fetal atrioventicular canal malformation  - absent nasal bone   Past Medical History: Past Medical History:  Diagnosis Date  . Anxiety    no recent panic attacks  . Hypertension    during pregnancy- not taking any meds at this time  . Medical history non-contributory   . SVD (spontaneous vaginal delivery) 03/14/2017    Past Surgical History: Past Surgical History:  Procedure Laterality Date  . PAROTIDECTOMY Left 07/05/2017   Procedure: PAROTIDECTOMY;  Surgeon: Rozetta Nunnery, MD;  Location: [facility];  Service: ENT;  Laterality: Left;    Obstetrical History: OB History    Gravida  2   Para  1   Term  1   Preterm      AB      Living  1     SAB      TAB      Ectopic      Multiple  0   Live Births  1           Social History: Social History   Socioeconomic History  . Marital status: Single    Spouse name: Not on file  . Number of children: Not on file  . Years of education: Not on file  . Highest education level: Not on file  Occupational History  . Not on file  Tobacco Use  . Smoking status: Never Smoker  . Smokeless tobacco: Never Used  Substance and Sexual Activity  . Alcohol use: No  . Drug use: No  . Sexual activity: Yes    Birth control/protection: None  Other Topics Concern  . Not on file  Social History Narrative  . Not on file   Social Determinants of Health   Financial Resource Strain:   . Difficulty of Paying Living Expenses: Not on file  Food Insecurity:   . Worried About  Charity fundraiser in the Last Year: Not on file  . Ran Out of Food in the Last Year: Not on file  Transportation Needs:   . Lack of Transportation (Medical): Not on file  . Lack of Transportation (Non-Medical): Not on file  Physical Activity:   . Days of Exercise per Week: Not on file  . Minutes of Exercise per Session: Not on file  Stress:   . Feeling of Stress : Not on file  Social Connections:   . Frequency of Communication with Friends and Family: Not on file  . Frequency of Social Gatherings with Friends and Family: Not on file  . Attends Religious Services: Not on file  . Active Member of Clubs or Organizations: Not on file  . Attends Archivist Meetings: Not on file  . Marital Status: Not on file    Family History: Family History  Problem Relation Age of Onset  . Diabetes Maternal Grandmother   . Hypertension Maternal Grandmother   . Stroke Maternal Grandmother   . Anemia Mother   . Anxiety disorder Maternal Aunt   . Alcohol abuse Maternal Grandfather     Allergies: Allergies  Allergen Reactions  . Robaxin [Methocarbamol]  Hives    Medications Prior to Admission  Medication Sig Dispense Refill Last Dose  . aspirin 81 MG chewable tablet Chew 2 tablets (162 mg total) by mouth daily. 30 tablet 5   . Elastic Bandages & Supports (COMFORT FIT MATERNITY SUPP SM) MISC Wear as directed. 1 each 0   . metroNIDAZOLE (FLAGYL) 500 MG tablet Take 1 tablet (500 mg total) by mouth 2 (two) times daily. (Patient not taking: Reported on 11/15/2018) 14 tablet 0   . Prenat w/o A-FeCbn-Meth-FA-DHA (PRENATE MINI) 29-0.6-0.4-350 MG CAPS Take 1 capsule by mouth daily before breakfast. 90 capsule 4   . Prenat-FeAsp-Meth-FA-DHA w/o A (PRENATE PIXIE) 10-0.6-0.4-200 MG CAPS Take 1 capsule by mouth daily before breakfast. (Patient not taking: Reported on 11/27/2018) 90 capsule 4      Review of Systems  All systems reviewed and negative except as stated in HPI  Physical Exam Blood  pressure 116/82, pulse 86, temperature 98.2 F (36.8 C), temperature source Oral, height 5\' 4"  (1.626 m), weight 68.4 kg, last menstrual period 05/10/2018, not currently breastfeeding. General appearance: alert, cooperative and no distress Lungs: clear to auscultation bilaterally Heart: regular rate and rhythm Abdomen: soft, non-tender; bowel sounds normal Extremities: No calf swelling or tenderness Presentation: cephalic- confirmed by bedside US  Fetal monitoring: 130/moderate/+accels/no decel Uterine activity: irregular mild  Dilation: 2 Effacement (%): 50 Station: Ballotable Exam by:: Veronic Baani Bober  Prenatal labs: ABO, Rh: O/Positive/-- (10/21 1039) Antibody: Negative (10/21 1039) Rubella: 1.33 (10/21 1039) RPR: Non Reactive (11/18 0901)  HBsAg: Negative (10/21 1039)  HIV: Non Reactive (11/18 0901)  GC/Chlamydia: negative  GBS: --[place]end[place] Cloud (01/13 0330)  2 hr Glucola: 79-136-108 Genetic screening:  Declined Anatomy US: abnormal, absent nasal bone, AV canal defect   Nursing Staff Provider  Office Location  [facility] Dating   LMP  Language  English Anatomy US  11-08-18  Flu Vaccine  Declined 11-27-18 Genetic Screen  Declined  TDaP vaccine   info given 11-27-18 Hgb A1C or  GTT  Third trimester normal  Rhogam     LAB RESULTS   Feeding Plan Breast/Bottle Blood Type O/Positive/-- (10/21 1039)   Contraception undecided Antibody Negative (10/21 1039)  Circumcision N/a female Rubella 1.33 (10/21 1039)  Pediatrician  [facility] RPR Non Reactive (11/18 0901)   Support Person mother HBsAg Negative (10/21 1039)   Prenatal Classes  HIV Non Reactive (11/18 0901)  BTL Consent  GBS  (For PCN allergy, check sensitivities)   VBAC Consent  Pap  10/30/18- negative     Hgb Electro    BP Cuff Has cuff CF     SMA     Waterbirth  [ ]  Class [ ]  Consent [ ]  CNM visit   Prenatal Transfer Tool  Maternal Diabetes: No Genetic Screening: Declined Maternal Ultrasounds/Referrals: IUGR, Absent  nasal bone and Cardiac defect Fetal Ultrasounds or other Referrals:  Fetal echo, Referred to Materal Fetal Medicine  Maternal Substance Abuse:  No Significant Maternal Medications:  Meds include: Other: Aspirin  Significant Maternal Lab Results: Group B Strep negative  No results found for this or any previous visit (from the past 24 hour(s)).  Patient Active Problem List   Diagnosis Date Noted  . Polyhydramnios affecting pregnancy in third trimester 01/25/2019  . Pregnancy affected by fetal growth restriction 11/27/2018  . 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 Supervision of other normal pregnancy, antepartum 10/30/2018  . 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 Hx of preeclampsia, prior pregnancy,  currently pregnant 03/08/2017    Assessment: Kathryn Barnes is a 34 y.o. G2P1001 at [redacted]w[redacted]d here for IOL for FGR, possible trisomy 21, absent nasal bone and cardiac defect complicating pregnancy   #Labor: IOL with FB and 1st dose Cytotec @ 0040 #Pain: Plans natural delivery - birth plan in room  #FWB: Cat 1  #ID:  GBS neg  #MOF: Breast  #MOC:POPs    Lajean Manes, CNM 01/25/2019, 1:11 AM

## 2019-01-25 NOTE — Discharge Summary (Signed)
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 yo G2P2002 at 345w1das admitted to the hospital 01/25/2019 for induction of labor.  Indication for induction: FGR.  Patient had an uncomplicated labor course as follows: Membrane Rupture Time/Date: 4:32 AM ,01/25/2019   Intrapartum Procedures: Episiotomy: None [1]                                         Lacerations:  None [1]  Patient had delivery of a Viable infant.  Information for the patient's newborn:  HoLavonya, Hoerner0[[id number]]Delivery Method: Vaginal, Spontaneous(Filed from Delivery Summary)    01/25/2019  Details of delivery can be found in separate delivery note.  Patient had a routine postpartum course. Patient is discharged home 01/27/19. Delivery time: 6:03 AM    Magnesium Sulfate received: No BMZ received: No Rhophylac:N/A MMR:No Transfusion:No  Physical exam  Vitals:   01/26/19 0535 01/26/19 1605 01/26/19 2348 01/27/19  0549  BP: (!) 87/58 101/68 111/74 104/70  Pulse: 76 70 81 71  Resp: '14 17 18 18  '$ Temp: 98.2 F (36.8 C) 98 F (36.7 C) 98 F (36.7 C) 98.3 F (36.8 C)  TempSrc: Oral Oral Oral Oral  SpO2: 100% 99% 100% 99%  Weight:      Height:       General: alert, cooperative and no distress Lochia: appropriate Uterine Fundus: firm Incision: N/A DVT Evaluation: No evidence of DVT seen on physical exam. No significant calf/ankle edema. Labs: Lab Results  Component Value Date   WBC 6.6 01/25/2019   HGB 11.8 (L) 01/25/2019   HCT 35.8 (L) 01/25/2019   MCV 85.6 01/25/2019   PLT 257 01/25/2019   CMP Latest Ref Rng & Units 03/13/2017  Glucose 65 - 99 mg/dL 99  BUN 6 - 20 mg/dL 11  Creatinine 0.44 - 1.00 mg/dL 0.64  Sodium 135 - 145 mmol/L 138  Potassium 3.5 - 5.1 mmol/L 4.0  Chloride 101 - 111 mmol/L 108  CO2 22 - 32 mmol/L 23  Calcium 8.9 - 10.3  mg/dL 9.0  Total Protein 6.5 - 8.1 g/dL 6.9  Total Bilirubin 0.3 - 1.2 mg/dL 0.5  Alkaline Phos 38 - 126 U/L 166(H)  AST 15 - 41 U/L 21  ALT 14 - 54 U/L 24    Discharge instruction: per After Visit Summary and "Baby and Me Booklet".  After visit meds:  Allergies as of 01/27/2019      Reactions   Robaxin [methocarbamol] Hives      Medication List    STOP taking these medications   aspirin 81 MG chewable tablet   metroNIDAZOLE 500 MG tablet Commonly known as: FLAGYL   Prenate Mini 29-0.6-0.4-350 MG Caps     TAKE these medications   acetaminophen 325 MG tablet Commonly known as: Tylenol Take 2 tablets (650 mg total) by mouth every 4 (four) hours as needed (for pain scale < 4).   [place] Supp Sm Misc Wear as directed.   ibuprofen 600 MG tablet Commonly known as: ADVIL Take 1 tablet (600 mg total) by mouth every 6 (six) hours.   norethindrone 0.35 MG tablet Commonly known as: Ortho Micronor Take 1 tablet (0.35 mg total) by mouth daily.   polyethylene glycol powder 17 GM/SCOOP powder Commonly known as:  GLYCOLAX/MIRALAX Take 255 g by mouth once for 1 dose.   Prenate Pixie 10-0.6-0.4-200 MG Caps Take 1 capsule by mouth daily before breakfast.       Diet: routine diet  Activity: Advance as tolerated. Pelvic rest for 6 weeks.   Outpatient follow up:4 weeks Follow up Appt: Future Appointments  Date Time Provider [place]  02/24/2019  1:40 PM Gavin Pound, CNM [facility] None   Follow up Visit:   Please schedule this patient for Postpartum visit in: 4 weeks with the following provider: Any provider For C/S patients schedule nurse incision check in weeks 2 weeks: no High risk pregnancy complicated by: FGR, absent nasal bone, AV canal defect Delivery mode:  SVD Anticipated Birth Control:  POPs started PP  PP Procedures needed: none  Schedule Integrated BH visit: no    Newborn Data: Live born female  Birth Weight: 5 lb 4.7 oz (2400 g) APGAR: 6, 8  Newborn Delivery   Birth date/time: 01/25/2019 06:03:00 Delivery type: Vaginal, Spontaneous      Baby Feeding: Bottle Disposition:NICU-suspected trisomy 21   01/27/2019 Clarnce Flock, MD

## 2019-01-26 NOTE — Lactation Note (Signed)
This note was copied from a baby's chart. Lactation Consultation Note  Patient Name: Kathryn Barnes [id number] Date: 01/26/2019 Reason for consult: Initial assessment;NICU baby;Early term 37-38.6wks Baby is 27 hours in the NICU.  Baby has possible trisomy 21 and heart defect.  Mom was started pumping yesterday but not pumping consistently.  Mom is also hand expressing small amounts of colostrum.  Instructed on importance of pumping and hand expressing every 3 hours to establish and maintain milk supply.  Mom has a DEBP at home.  Recommended mom bring her pump pieces to the NICU so she can pump in her baby's room.  Mom has also attempted putting baby to breast but would like more assist.  Instructed to ask nurse to page [facility] for assist.  Providing Breastmilk For Your Baby In NICU book and Breastfeeding consultation services information given.  Encouraged to call prn.  Maternal Data Has patient been taught Hand Expression?: Yes  Feeding    LATCH Score                   Interventions    Lactation Tools Discussed/Used Initiated by:: RN Date initiated:: 01/25/19   Consult Status Consult Status: Follow-up Date: 01/27/19 Follow-up type: In-patient    Ave Filter 01/26/2019, 9:08 AM

## 2019-01-26 NOTE — Progress Notes (Signed)
Post Partum Day 1 Subjective: no complaints, up ad lib, voiding, tolerating PO and + flatus  Objective: Blood pressure (!) 87/58, pulse 76, temperature 98.2 F (36.8 C), temperature source Oral, resp. rate 14, height 5\' 4"  (1.626 m), weight 68.4 kg, last menstrual period 05/10/2018, SpO2 100 %, unknown if currently breastfeeding.  Physical Exam:  General: alert, cooperative and no distress Lochia: appropriate Uterine Fundus: firm Incision: n/a DVT Evaluation: No evidence of DVT seen on physical exam.  Recent Labs    01/25/19 0113  HGB 11.8*  HCT 35.8*    Assessment/Plan: Plan for discharge tomorrow, Breastfeeding and Contraception POPs   LOS: 1 day   [place] 01/26/2019, 8:59 AM

## 2019-01-26 NOTE — Lactation Note (Addendum)
This note was copied from a baby's chart. Lactation Consultation Note  Patient Name: Girl Serenitee Hillegas [id number] Date: 01/26/2019  P2, 19 hour, ETI infant in NICU. LC entered room mom was asleep at this time. LC talked with Nurse,  Mom has been  given DEBP by RN and she is pumping according to guidelines every 3 hours on initial setting with DEBP. Mom prefers to be seen in the morning for [facility] services.   Maternal Data    Feeding    LATCH Score Latch: Repeated attempts needed to sustain latch, nipple held in mouth throughout feeding, stimulation needed to elicit sucking reflex.(Latch x 26 seconds (nuzzled) no audible swallowing noted )  Audible Swallowing: None  Type of Nipple: Everted at rest and after stimulation  Comfort (Breast/Nipple): Soft / non-tender  Hold (Positioning): Assistance needed to correctly position infant at breast and maintain latch.  LATCH Score: 6  Interventions Interventions: Assisted with latch;Skin to skin;Breast massage;Adjust position;Position options  Lactation Tools Discussed/Used     Consult Status      Vicente Serene 01/26/2019, 1:28 AM

## 2019-01-27 ENCOUNTER — Encounter: Payer: Self-pay | Admitting: Certified Nurse Midwife

## 2019-01-27 DIAGNOSIS — O411290 Chorioamnionitis, unspecified trimester, not applicable or unspecified: Secondary | ICD-10-CM | POA: Insufficient documentation

## 2019-01-27 LAB — SURGICAL PATHOLOGY

## 2019-01-27 MED ORDER — NORETHINDRONE 0.35 MG PO TABS: 1.0000 | ORAL_TABLET | Freq: Every day | ORAL | 11 refills | Status: DC

## 2019-01-27 MED ORDER — ACETAMINOPHEN 325 MG PO TABS: 650.0000 mg | ORAL_TABLET | ORAL | 0 refills | Status: DC | PRN

## 2019-01-27 MED ORDER — POLYETHYLENE GLYCOL 3350 17 GM/SCOOP PO POWD: 1.0000 | Freq: Once | ORAL | 0 refills | Status: AC

## 2019-01-27 MED ORDER — IBUPROFEN 600 MG PO TABS: 600.0000 mg | ORAL_TABLET | Freq: Four times a day (QID) | ORAL | 0 refills | Status: DC

## 2019-01-27 NOTE — Clinical Social Work Maternal (Signed)
CLINICAL SOCIAL WORK MATERNAL/CHILD NOTE  Patient Details  Name: Kathryn Barnes MRN: [id number] Date of Birth: 02/03/1985  Date:  Jun 24, 2019  Clinical Social Worker Initiating Note:  Laurey Arrow Date/Time: Initiated:  01/27/19/1100     Child's Name:  Kathryn Barnes   Biological Parents:  Mother, Father   Need for Interpreter:  None   Reason for Referral:  Parental Support of Premature Babies < 12 weeks/or Critically Ill babies, Behavioral Health Concerns   Address:  [address] [place] [address]    Phone number:  [phone] (home)     Additional phone number:   Household Members/Support Persons (HM/SP):   Household Member/Support Person 1(MOB reports that she resides with her parents.)   HM/SP Name Relationship DOB or Age  HM/SP -1 Kathryn Barnes daughter 03/14/2017  HM/SP -2        HM/SP -3        HM/SP -4        HM/SP -5        HM/SP -6        HM/SP -7        HM/SP -8          Natural Supports (not living in the home):  Extended Family, Immediate Family, Spouse/significant other   Professional Supports: None   Employment: Unemployed   Type of Work:     Education:  Public librarian arranged:    Museum/gallery curator Resources:  Medicaid   Other Resources:  Physicist, medical , [place] Considerations Which May Impact Care:  None Reported  Strengths:  Ability to meet basic needs , Home prepared for child , Pediatrician chosen   Psychotropic Medications:         Pediatrician:    [place] area  Pediatrician List:   [facility]  [place]      Pediatrician Fax Number:    Risk Factors/Current Problems:  Mental Health Concerns    Cognitive State:  Able to Concentrate , Alert , Insightful , Goal Oriented    Mood/Affect:  Interested , Happy , Comfortable    CSW Assessment: CSW met with MOB at infant's  bedside in room 315.  When CSW arrived, MOB was holding infant and engaging in skin to skin.  MOB and infant appeared happy and comfortable. CSW explained CSW's role and MOB was open to meeting with CSW.  MOB was polite, easy to engage, and forthcoming.    CSW asked about MOB's thoughts and feeling regarding infant's NICU admission.  MOB shared, "It's bitter sweet.  I want her to go home with me but I know she needs to stay here; I just want to be selfish." CSW validated and normalized MOB's thoughts and feelings and reviewed other emotions that MOB may experience during the postpartum period. CSW asked about MOB's MH hx and MOB denied a hx.  MOB reported that she was dx with cancer in 2019 and seen a therapist to process her thoughts and feelings. MOB reports that here anxiety and feelings of sadness were situational and communicated feeling better after a few sessions of therapy and having her cancer in remission. CSW provided education regarding the baby blues period vs. perinatal mood disorders, discussed treatment and gave resources for mental health follow up if concerns arise.  CSW recommends self-evaluation during the postpartum time period using the New Mom Checklist from  Postpartum Progress and encouraged MOB to contact a medical professional if symptoms are noted at any time. MOB presented with insight and awareness and did not display any acute MH symptoms. CSW assessed for safety and MOB denied SI, HI, and DV.  MOB reports having a good support team and feelings comfortable seeking help if needed.  At this time, MOB is unsure of how much of a support FOB is going to be.  CSW also made MOB aware that infant is eligible for SSI benefits. CSW explained in detail about SSI application process, and MOB was understanding. MOB is aware that CSW is available for assistance during the application process if needed.   CSW reports having all essential items to care for infant including a used car seat and a  new bassinet. MOB denied barriers to visiting with infant while infant remains in NICU.   CSW will continue to offer MOB resources and supports while infant remains in NICU.   CSW Plan/Description:  Psychosocial Support and Ongoing Assessment of Needs, Sudden Infant Death Syndrome (SIDS) Education, Perinatal Mood and Anxiety Disorder (PMADs) Education, Other Patient/Family Education, Theatre stage manager Income (SSI) Information, Other Information/Referral to [place], MSW, [facility] Clinical Social Work [phone]  Dimple Nanas, [place] 01/27/2019, 12:06 PM

## 2019-01-27 NOTE — Discharge Instructions (Signed)

## 2019-01-29 ENCOUNTER — Encounter (HOSPITAL_COMMUNITY): Payer: Self-pay

## 2019-01-29 ENCOUNTER — Ambulatory Visit (HOSPITAL_COMMUNITY): Payer: Medicaid Other

## 2019-02-24 ENCOUNTER — Telehealth (INDEPENDENT_AMBULATORY_CARE_PROVIDER_SITE_OTHER): Payer: Medicaid Other

## 2019-02-24 DIAGNOSIS — Z3041 Encounter for surveillance of contraceptive pills: Secondary | ICD-10-CM

## 2019-02-24 NOTE — Progress Notes (Signed)
TELEHEALTH POSTPARTUM VIRTUAL VIDEO VISIT ENCOUNTER NOTE   Provider location: [facility] for [facility] at [place]   I connected with [facility] on 02/24/19 at  1:40 PM EST by MyChart Video Encounter at home and verified that I am speaking with the correct person using two identifiers.    I discussed the limitations, risks, security and privacy concerns of performing an evaluation and management service virtually and the availability of in person appointments. I also discussed with the patient that there may be a patient responsible charge related to this service. The patient expressed understanding and agreed to proceed.  Chief Complaint: Postpartum Visit  History of Present Illness: Toula Charbonnet is a 34 y.o. African-American [id number] being evaluated for postpartum followup.    She is s/p normal spontaneous vaginal delivery on Jan 16th at 37.1 weeks; she was discharged to home on PPD#2. Pregnancy complicated by GHTN. Baby is doing well, but is on a NG tube.  Patient states she receives help from her mother for infant care.   Complains of some mild constipation, but has taken miralax with relief.   Vaginal bleeding or discharge: Yes -"Menstrual bleed" Intercourse: No  Contraception: oral progesterone-only contraceptive-Started POPs day after she left the hospital Mode of feeding infant: Bottle and Breast PP depression s/s: No .  Any bowel or bladder issues: No  Pap smear: no abnormalities (date: Oct 30, 2018)  Review of Systems: Positive for constipation. Her 12 point review of systems is negative or as noted in the History of Present Illness.  Patient Active Problem List   Diagnosis Date Noted  . Acute chorioamnionitis 01/27/2019  . Polyhydramnios affecting pregnancy in third trimester 01/25/2019  . SVD (spontaneous vaginal delivery) 01/25/2019  . Pregnancy affected by fetal growth restriction 11/27/2018  . 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 Supervision of other normal pregnancy, antepartum 10/30/2018  . 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 Hx of preeclampsia, prior pregnancy, currently pregnant 03/08/2017    Medications [employer] "Ms. Berberian" had no medications administered during this visit. Current Outpatient Medications  Medication Sig Dispense Refill  . acetaminophen (TYLENOL) 325 MG tablet Take 2 tablets (650 mg total) by mouth every 4 (four) hours as needed (for pain scale < 4). 30 tablet 0  . Elastic Bandages & Supports (COMFORT FIT MATERNITY SUPP SM) MISC Wear as directed. 1 each 0  . ibuprofen (ADVIL) 600 MG tablet Take 1 tablet (600 mg total) by mouth every 6 (six) hours. 30 tablet 0  . norethindrone (ORTHO MICRONOR) 0.35 MG tablet Take 1 tablet (0.35 mg total) by mouth daily. 1 Package 11  . Prenat-FeAsp-Meth-FA-DHA w/o A (PRENATE PIXIE) 10-0.6-0.4-200 MG CAPS Take 1 capsule by mouth daily before breakfast. (Patient not taking: Reported on 11/27/2018) 90 capsule 4   No current facility-administered medications for this visit.    Allergies Robaxin [methocarbamol]  Physical Exam:  Ht 5' 4.5" (1.638 m)   Wt 140 lb (63.5 kg)   LMP 05/10/2018   Breastfeeding Yes   BMI 23.66 kg/m   Mental Status: Normal mood and affect. Normal behavior. Normal judgment and thought content.   Respiratory: Normal respiratory effort noted, no problems with respiration noted  Rest of physical exam deferred due to type of encounter  PP Depression Screening:   Edinburgh Postnatal Depression Scale Screening Tool 02/24/2019 01/27/2019 01/25/2019  I have been able to laugh and see the funny side of things. 0 0 (No Data)  I  have looked forward with enjoyment to things. 0 0 -  I have blamed myself unnecessarily when things went wrong. 1 1 -  I have been anxious or worried for no good reason. 0 2 -  I have felt scared or panicky for no good reason. 0 1 -  Things have been getting  on top of me. 0 0 -  I have been so unhappy that I have had difficulty sleeping. 0 0 -  I have felt sad or miserable. 0 0 -  I have been so unhappy that I have been crying. 0 0 -  The thought of harming myself has occurred to me. 0 0 -  Edinburgh Postnatal Depression Scale Total 1 4 -     Assessment:Patient is a 34 y.o. [id number] who is 4 weeks postpartum from a normal spontaneous vaginal delivery.  She is doing well.   Plan: -Okay to return to normal activities including work and sex as desired. -Encouraged usage of lubrication during sexual activity for as long as she is breastfeeding.  -Informed that bleeding is normal considering that she has been taking birth control pills since delivery.  -Encouraged to call if questions or concerns arise prior to next visit.   RTC in 8 months for yearly physical.  I discussed the assessment and treatment plan with the patient. The patient was provided an opportunity to ask questions and all were answered. The patient agreed with the plan and demonstrated an understanding of the instructions.   The patient was advised to call back or seek an in-person evaluation/go to the ED for any concerning postpartum symptoms.  I provided 6 minutes of face-to-face time during this encounter.   Maryann Conners, [place] for [employer] [facility], [place]

## 2019-02-24 NOTE — Patient Instructions (Signed)
Norethindrone tablets (contraception) What is this medicine? NORETHINDRONE (nor eth IN drone) is an oral contraceptive. The product contains a female hormone known as a progestin. It is used to prevent pregnancy. This medicine may be used for other purposes; ask your health care provider or pharmacist if you have questions. COMMON BRAND NAME(S): Camila, Deblitane 28-Day, Errin, Heather, Jencycla, Jolivette, Lyza, Nor-QD, Nora-BE, Norlyroc, Ortho Micronor, Sharobel 28-Day What should I tell my health care provider before I take this medicine? They need to know if you have any of these conditions:  blood vessel disease or blood clots  breast, cervical, or vaginal cancer  diabetes  heart disease  kidney disease  liver disease  mental depression  migraine  seizures  stroke  vaginal bleeding  an unusual or allergic reaction to norethindrone, other medicines, foods, dyes, or preservatives  pregnant or trying to get pregnant  breast-feeding How should I use this medicine? Take this medicine by mouth with a glass of water. You may take it with or without food. Follow the directions on the prescription label. Take this medicine at the same time each day and in the order directed on the package. Do not take your medicine more often than directed. Contact your pediatrician regarding the use of this medicine in children. Special care may be needed. This medicine has been used in female children who have started having menstrual periods. A patient package insert for the product will be given with each prescription and refill. Read this sheet carefully each time. The sheet may change frequently. Overdosage: If you think you have taken too much of this medicine contact a poison control center or emergency room at once. NOTE: This medicine is only for you. Do not share this medicine with others. What if I miss a dose? Try not to miss a dose. Every time you miss a dose or take a dose late  your chance of pregnancy increases. When 1 pill is missed (even if only 3 hours late), take the missed pill as soon as possible and continue taking a pill each day at the regular time (use a back up method of birth control for the next 48 hours). If more than 1 dose is missed, use an additional birth control method for the rest of your pill pack until menses occurs. Contact your health care professional if more than 1 dose has been missed. What may interact with this medicine? Do not take this medicine with any of the following medications:  amprenavir or fosamprenavir  bosentan This medicine may also interact with the following medications:  antibiotics or medicines for infections, especially rifampin, rifabutin, rifapentine, and griseofulvin, and possibly penicillins or tetracyclines  aprepitant  barbiturate medicines, such as phenobarbital  carbamazepine  felbamate  modafinil  oxcarbazepine  phenytoin  ritonavir or other medicines for HIV infection or AIDS  St. John's wort  topiramate This list may not describe all possible interactions. Give your health care provider a list of all the medicines, herbs, non-prescription drugs, or dietary supplements you use. Also tell them if you smoke, drink alcohol, or use illegal drugs. Some items may interact with your medicine. What should I watch for while using this medicine? Visit your doctor or health care professional for regular checks on your progress. You will need a regular breast and pelvic exam and Pap smear while on this medicine. Use an additional method of birth control during the first cycle that you take these tablets. If you have any reason to think you   are pregnant, stop taking this medicine right away and contact your doctor or health care professional. If you are taking this medicine for hormone related problems, it may take several cycles of use to see improvement in your condition. This medicine does not protect you  against HIV infection (AIDS) or any other sexually transmitted diseases. What side effects may I notice from receiving this medicine? Side effects that you should report to your doctor or health care professional as soon as possible:  breast tenderness or discharge  pain in the abdomen, chest, groin or leg  severe headache  skin rash, itching, or hives  sudden shortness of breath  unusually weak or tired  vision or speech problems  yellowing of skin or eyes Side effects that usually do not require medical attention (report to your doctor or health care professional if they continue or are bothersome):  changes in sexual desire  change in menstrual flow  facial hair growth  fluid retention and swelling  headache  irritability  nausea  weight gain or loss This list may not describe all possible side effects. Call your doctor for medical advice about side effects. You may report side effects to FDA at 1-800-FDA-1088. Where should I keep my medicine? Keep out of the reach of children. Store at room temperature between 15 and 30 degrees C (59 and 86 degrees F). Throw away any unused medicine after the expiration date. NOTE: This sheet is a summary. It may not cover all possible information. If you have questions about this medicine, talk to your doctor, pharmacist, or health care provider.  2020 Elsevier/Gold Standard (2011-09-15 16:41:35)  

## 2019-03-20 ENCOUNTER — Other Ambulatory Visit: Payer: Self-pay

## 2019-03-20 ENCOUNTER — Encounter: Payer: Self-pay | Admitting: Family Medicine

## 2019-03-20 ENCOUNTER — Ambulatory Visit: Payer: Medicaid Other | Admitting: Family Medicine

## 2019-03-20 VITALS — BP 102/62 | HR 77 | Temp 98.2°F | Ht 64.5 in | Wt 147.0 lb

## 2019-03-20 DIAGNOSIS — C07 Malignant neoplasm of parotid gland: Secondary | ICD-10-CM

## 2019-03-20 NOTE — Progress Notes (Signed)
    SUBJECTIVE:   CHIEF COMPLAINT / HPI:  Ms Jansson is a 34 yr old female who presents to the clinic for a initial PCP visit.   ENT referral Pt would like ENT referral. She had a mucoepidermoid carcinoma of parotid gland which was treated with surgical removal and radiotherapy in 2019. 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 Lost to follow up as lost job and did not have insurance. Hearing through left ear is muffled and it sometimes leaks fluid.  PMH: Mucoepidermoid carcinoma of parotid gland  PSH: Left total parotidectomy   DH: Allergies: Robaxin causes hives. OCP.   FH: None  SH: Lives at home with 2 children, 80 week old and 55 year old. Uemployed. Using savings to support herself financially. Does not smoke. Drinks 1 glass of wine  Every other day.  Post partum  Spontaneous vaginal delivery on Jan 16th at 37.1 weeks; Discharged home on PPD#2. Pregnancy complicated by GHTN. Baby has triomy 21 and just got out of NICU last week. Has G tube and may require surgery this summer. No breast feeding. Denies abdominal pain or lochia. Is not sexually active but taking OCP.  PERTINENT  PMH / PSH: Parotid gland tumour   OBJECTIVE:   BP 102/62   Pulse 77   Temp 98.2 F (36.8 C) (Oral)   Ht 5' 4.5" (1.638 m)   Wt 147 lb (66.7 kg)   LMP 03/14/2019   SpO2 98%   BMI 24.84 kg/m   General: Alert and cooperative and appears to be in no acute distress Cardio: Normal S1 and S2, RRR, No murmurs or rubs.   Pulm: CTAB, normal WOB Abdomen: Bowel sounds normal. Abdomen soft and non-tender.  Extremities: No peripheral edema. Warm/ well perfused.  Strong radial pulse. Neuro: Cranial nerves grossly intact    ASSESSMENT/PLAN:  Surgical site healing well.   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    Lattie Haw,  MD [place]

## 2019-03-20 NOTE — Patient Instructions (Addendum)
Ms Ratte,  It was lovely to meet you today. Thank you for coming to see me, I am happy to be your new provider!! I have referred you to ENT for follow up of your parotid gland removal, if you do not hear from them in the next 1-2 weeks then please contact our clinic.  Best wishes,  Dr Posey Pronto

## 2019-03-23 NOTE — Assessment & Plan Note (Addendum)
Referred to ENT for surgical follow up after lost to follow up after losing insurance.

## 2019-04-06 ENCOUNTER — Encounter: Payer: Self-pay | Admitting: Family Medicine

## 2019-04-30 ENCOUNTER — Other Ambulatory Visit: Payer: Self-pay | Admitting: Family Medicine

## 2019-04-30 ENCOUNTER — Encounter: Payer: Self-pay | Admitting: Family Medicine

## 2019-07-03 DIAGNOSIS — Z8509 Personal history of malignant neoplasm of other digestive organs: Secondary | ICD-10-CM | POA: Diagnosis not present

## 2019-07-03 DIAGNOSIS — H9212 Otorrhea, left ear: Secondary | ICD-10-CM | POA: Diagnosis not present

## 2019-07-03 DIAGNOSIS — H61302 Acquired stenosis of left external ear canal, unspecified: Secondary | ICD-10-CM | POA: Diagnosis not present

## 2019-07-03 DIAGNOSIS — Z923 Personal history of irradiation: Secondary | ICD-10-CM | POA: Diagnosis not present

## 2019-07-03 DIAGNOSIS — J343 Hypertrophy of nasal turbinates: Secondary | ICD-10-CM | POA: Diagnosis not present

## 2019-07-03 DIAGNOSIS — R29810 Facial weakness: Secondary | ICD-10-CM | POA: Diagnosis not present

## 2019-07-18 ENCOUNTER — Encounter: Payer: Self-pay | Admitting: Family Medicine

## 2019-07-18 ENCOUNTER — Ambulatory Visit (INDEPENDENT_AMBULATORY_CARE_PROVIDER_SITE_OTHER): Payer: Medicaid Other | Admitting: Family Medicine

## 2019-07-18 ENCOUNTER — Other Ambulatory Visit: Payer: Self-pay

## 2019-07-18 ENCOUNTER — Other Ambulatory Visit (HOSPITAL_COMMUNITY)
Admission: RE | Admit: 2019-07-18 | Discharge: 2019-07-18 | Disposition: A | Discharge status: Home / Self Care | Payer: Medicaid Other | Source: Ambulatory Visit | Attending: Family Medicine | Admitting: Family Medicine

## 2019-07-18 VITALS — BP 98/60 | HR 74 | Wt 153.0 lb

## 2019-07-18 DIAGNOSIS — N898 Other specified noninflammatory disorders of vagina: Secondary | ICD-10-CM | POA: Insufficient documentation

## 2019-07-18 DIAGNOSIS — N926 Irregular menstruation, unspecified: Secondary | ICD-10-CM

## 2019-07-18 DIAGNOSIS — Z30016 Encounter for initial prescription of transdermal patch hormonal contraceptive device: Secondary | ICD-10-CM | POA: Diagnosis not present

## 2019-07-18 DIAGNOSIS — Z113 Encounter for screening for infections with a predominantly sexual mode of transmission: Secondary | ICD-10-CM

## 2019-07-18 LAB — POCT WET PREP (WET MOUNT)
Clue Cells Wet Prep Whiff POC: NEGATIVE
Trichomonas Wet Prep HPF POC: ABSENT

## 2019-07-18 LAB — POCT URINE PREGNANCY: Preg Test, Ur: NEGATIVE

## 2019-07-18 MED ORDER — NORELGESTROMIN-ETH ESTRADIOL 150-35 MCG/24HR TD PTWK: 1.0000 | MEDICATED_PATCH | TRANSDERMAL | 12 refills | Status: DC

## 2019-07-18 NOTE — Patient Instructions (Signed)
Ethinyl Estradiol; Norelgestromin skin patches What is this medicine? ETHINYL ESTRADIOL;NORELGESTROMIN (ETH in il es tra DYE ole; nor el JES troe min) skin patch is used as a contraceptive (birth control method). This medicine combines two types of female hormones, an estrogen and a progestin. This patch is used to prevent ovulation and pregnancy. This medicine may be used for other purposes; ask your health care provider or pharmacist if you have questions. COMMON BRAND NAME(S): Ortho Becky Sax What should I tell my health care provider before I take this medicine? They need to know if you have or ever had any of these conditions:  abnormal vaginal bleeding  blood vessel disease or blood clots  breast, cervical, endometrial, ovarian, liver, or uterine cancer  diabetes  gallbladder disease  having surgery  heart disease or recent heart attack  high blood pressure  high cholesterol or triglycerides  history of irregular heartbeat or heart valve problems  kidney disease  liver disease  migraine headaches  protein C deficiency  protein S deficiency  recently had a baby, miscarriage, or abortion  stroke  systemic lupus erythematosus (SLE)  tobacco smoker  an unusual or allergic reaction to estrogens, progestins, other medicines, foods, dyes, or preservatives  pregnant or trying to get pregnant  breast-feeding How should I use this medicine? This patch is applied to the skin. Follow the directions on the prescription label. Apply to clean, dry, healthy skin on the buttock, abdomen, upper outer arm or upper torso, in a place where it will not be rubbed by tight clothing. Do not use lotions or other cosmetics on the site where the patch will go. Press the patch firmly in place for 10 seconds to ensure good contact with the skin. Change the patch every 7 days on the same day of the week for 3 weeks. You will then have a break from the patch for 1 week, after which you  will apply a new patch. Do not use your medicine more often than directed. Contact your pediatrician regarding the use of this medicine in children. Special care may be needed. This medicine has been used in female children who have started having menstrual periods. A patient package insert for the product will be given with each prescription and refill. Read this sheet carefully each time. The sheet may change frequently. Overdosage: If you think you have taken too much of this medicine contact a poison control center or emergency room at once. NOTE: This medicine is only for you. Do not share this medicine with others. What if I miss a dose? You will need to replace your patch once a week as directed. If your patch is lost or falls off, contact your health care professional for advice. You may need to use another form of birth control if your patch has been off for more than 1 day. What may interact with this medicine? Do not take this medicine with the following medications:  dasabuvir; ombitasvir; paritaprevir; ritonavir  ombitasvir; paritaprevir; ritonavir This medicine may also interact with the following medications:  acetaminophen  antibiotics or medicines for infections, especially rifampin, rifabutin, rifapentine, and possibly penicillins or tetracyclines  aprepitant or fosaprepitant  armodafinil  ascorbic acid (vitamin C)  barbiturate medicines, such as phenobarbital or primidone  bosentan  certain antiviral medicines for hepatitis, HIV or AIDS  certain medicines for cancer treatment  certain medicines for seizures like carbamazepine, clobazam, felbamate, lamotrigine, oxcarbazepine, phenytoin, rufinamide, topiramate  certain medicines for treating high cholesterol  cyclosporine  dantrolene  elagolix  flibanserin  grapefruit juice  lesinurad  medicines for diabetes  medicines to treat fungal infections, such as griseofulvin, miconazole, fluconazole,  ketoconazole, itraconazole, posaconazole or voriconazole  mifepristone  mitotane  modafinil  morphine  mycophenolate  St. John's wort  tamoxifen  temazepam  theophylline or aminophylline  thyroid hormones  tizanidine  tranexamic acid  ulipristal  warfarin This list may not describe all possible interactions. Give your health care provider a list of all the medicines, herbs, non-prescription drugs, or dietary supplements you use. Also tell them if you smoke, drink alcohol, or use illegal drugs. Some items may interact with your medicine. What should I watch for while using this medicine? Visit your doctor or health care professional for regular checks on your progress. You will need a regular breast and pelvic exam and Pap smear while on this medicine. Use an additional method of contraception during the first cycle that you use this patch. If you have any reason to think you are pregnant, stop using this medicine right away and contact your doctor or health care professional. If you are using this medicine for hormone related problems, it may take several cycles of use to see improvement in your condition. Smoking increases the risk of getting a blood clot or having a stroke while you are using hormonal birth control, especially if you are more than 33 years old. You are strongly advised not to smoke. This medicine can make your body retain fluid, making your fingers, hands, or ankles swell. Your blood pressure can go up. Contact your doctor or health care professional if you feel you are retaining fluid. This medicine can make you more sensitive to the sun. Keep out of the sun. If you cannot avoid being in the sun, wear protective clothing and use sunscreen. Do not use sun lamps or tanning beds/booths. If you wear contact lenses and notice visual changes, or if the lenses begin to feel uncomfortable, consult your eye care specialist. In some women, tenderness, swelling, or  minor bleeding of the gums may occur. Notify your dentist if this happens. Brushing and flossing your teeth regularly may help limit this. See your dentist regularly and inform your dentist of the medicines you are taking. If you are going to have elective surgery or a MRI, you may need to stop using this medicine before the surgery or MRI. Consult your health care professional for advice. This medicine does not protect you against HIV infection (AIDS) or any other sexually transmitted diseases. What side effects may I notice from receiving this medicine? Side effects that you should report to your doctor or health care professional as soon as possible:  allergic reactions such as skin rash or itching, hives, swelling of the lips, mouth, tongue, or throat  breast tissue changes or discharge  dark patches of skin on your forehead, cheeks, upper lip, and chin  depression  high blood pressure  migraines or severe, sudden headaches  missed menstrual periods  signs and symptoms of a blood clot such as breathing problems; changes in vision; chest pain; severe, sudden headache; pain, swelling, warmth in the leg; trouble speaking; sudden numbness or weakness of the face, arm or leg  skin reactions at the patch site such as blistering, bleeding, itching, rash, or swelling  stomach pain  yellowing of the eyes or skin Side effects that usually do not require medical attention (report these to your doctor or health care professional if they continue or are bothersome):  breast tenderness  irregular vaginal bleeding or spotting, particularly during the first 3 months of use  headache  nausea  painful menstrual periods  skin redness or mild irritation at site where applied  weight gain (slight) This list may not describe all possible side effects. Call your doctor for medical advice about side effects. You may report side effects to FDA at 1-800-FDA-1088. Where should I keep my  medicine? Keep out of the reach of children. Store at room temperature between 15 and 30 degrees C (59 and 86 degrees F). Keep the patch in its pouch until time of use. Throw away any unused medicine after the expiration date. Dispose of used patches properly. Since a used patch may still contain active hormones, fold the patch in half so that it sticks to itself prior to disposal. Throw away in a place where children or pets cannot reach. NOTE: This sheet is a summary. It may not cover all possible information. If you have questions about this medicine, talk to your doctor, pharmacist, or health care provider.  2020 Elsevier/Gold Standard (2018-04-02 11:56:29)  

## 2019-07-19 ENCOUNTER — Encounter: Payer: Self-pay | Admitting: Family Medicine

## 2019-07-19 LAB — HIV ANTIBODY (ROUTINE TESTING W REFLEX): HIV Screen 4th Generation wRfx: NONREACTIVE

## 2019-07-19 LAB — RPR: RPR Ser Ql: NONREACTIVE

## 2019-07-20 DIAGNOSIS — Z309 Encounter for contraceptive management, unspecified: Secondary | ICD-10-CM | POA: Insufficient documentation

## 2019-07-20 DIAGNOSIS — Z113 Encounter for screening for infections with a predominantly sexual mode of transmission: Secondary | ICD-10-CM | POA: Insufficient documentation

## 2019-07-20 NOTE — Progress Notes (Signed)
    SUBJECTIVE:   CHIEF COMPLAINT / HPI:   Kathryn Barnes is a 34 yr old female who presents today for STD check  STD check Patient has been sexually active with 2 different female partners since birth of daughter 5 months. With one partner she has unprotected sex and with the other it was was protected with a condom. Has both oral and penetrative sex. Has long standing clear/white mucus vaginal discharge. No dysuria, irregular vaginal bleeding. LMP April 5th. Menstrual cycles are usually irregular.   Birth control Patient is not currently using birth control. Does not want to get pregnant any time soon but is considering a pregnancy in the future. Would like to speak about birth control options today and so we discussed her options. She had used the patch previously which worked well for her except she did experience minor side effects. She does not want to use nexplanon, IUD or depo. Does not want to take pills as she may forget to take them. Non-smoker, no hx of HTN, migraine with aura, DVT or PE.  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 White cervical discharge visualized. No tenderness on internal exam  ASSESSMENT/PLAN:   Encounter for birth control Upreg negative today. Encouraged patient to choose form of birth control today given that she would like to avoid a pregnancy. She opted for transdermal patch which she has used before. Recommended that given her menstrual cycles are irregular she should use condoms for the next 2 weeks. Follow up with me if any  concerns/questions.  Screening for STDs (sexually transmitted diseases) Routine GC, chlamydia with wet prep and RPR, Hep c and HIV labs today. Will call patient if labs are abnormal.     Lattie Haw, MD [place]

## 2019-07-20 NOTE — Assessment & Plan Note (Signed)
Upreg negative today. Encouraged patient to choose form of birth control today given that she would like to avoid a pregnancy. She opted for transdermal patch which she has used before. Recommended that given her menstrual cycles are irregular she should use condoms for the next 2 weeks. Follow up with me if any concerns/questions.

## 2019-07-20 NOTE — Assessment & Plan Note (Signed)
Routine GC, chlamydia with wet prep and RPR, Hep c and HIV labs today. Will call patient if labs are abnormal.

## 2019-07-21 LAB — CERVICOVAGINAL ANCILLARY ONLY
Chlamydia: NEGATIVE
Comment: NEGATIVE
Comment: NORMAL
Neisseria Gonorrhea: NEGATIVE

## 2019-07-31 DIAGNOSIS — H61302 Acquired stenosis of left external ear canal, unspecified: Secondary | ICD-10-CM | POA: Diagnosis not present

## 2019-09-02 DIAGNOSIS — H938X2 Other specified disorders of left ear: Secondary | ICD-10-CM | POA: Diagnosis not present

## 2019-09-02 DIAGNOSIS — H61302 Acquired stenosis of left external ear canal, unspecified: Secondary | ICD-10-CM | POA: Diagnosis not present

## 2019-10-03 DIAGNOSIS — H61302 Acquired stenosis of left external ear canal, unspecified: Secondary | ICD-10-CM | POA: Diagnosis not present

## 2019-10-17 DIAGNOSIS — G510 Bell's palsy: Secondary | ICD-10-CM | POA: Diagnosis not present

## 2019-10-17 DIAGNOSIS — H61302 Acquired stenosis of left external ear canal, unspecified: Secondary | ICD-10-CM | POA: Diagnosis not present

## 2019-10-17 DIAGNOSIS — C07 Malignant neoplasm of parotid gland: Secondary | ICD-10-CM | POA: Diagnosis not present

## 2019-10-31 ENCOUNTER — Encounter: Payer: Self-pay | Admitting: Family Medicine

## 2019-11-03 DIAGNOSIS — H9012 Conductive hearing loss, unilateral, left ear, with unrestricted hearing on the contralateral side: Secondary | ICD-10-CM | POA: Diagnosis not present

## 2019-11-03 DIAGNOSIS — H61302 Acquired stenosis of left external ear canal, unspecified: Secondary | ICD-10-CM | POA: Diagnosis not present

## 2019-11-11 ENCOUNTER — Other Ambulatory Visit: Payer: Self-pay | Admitting: Family Medicine

## 2019-12-14 NOTE — Progress Notes (Deleted)
     SUBJECTIVE:   CHIEF COMPLAINT / HPI:   Kathryn Barnes is a 34 y.o. female presents with **    Health maintenance  Covid vaccine: Flu vaccine: Colonoscopy: Pap:     PERTINENT  PMH / PSH: ***  OBJECTIVE:   There were no vitals taken for this visit.   General: Alert, no acute distress Cardio: Normal S1 and S2, RRR, no r/m/g Pulm: CTAB, normal work of breathing Abdomen: Bowel sounds normal. Abdomen soft and non-tender.  Extremities: No peripheral edema.  Neuro: Cranial nerves grossly intact   ASSESSMENT/PLAN:   No problem-specific Assessment & Plan notes found for this encounter.     Lattie Haw, MD [place]

## 2019-12-15 ENCOUNTER — Ambulatory Visit: Payer: Medicaid Other | Admitting: Family Medicine

## 2019-12-15 ENCOUNTER — Other Ambulatory Visit: Payer: Self-pay

## 2020-07-07 ENCOUNTER — Ambulatory Visit: Payer: Medicaid Other | Admitting: Family Medicine

## 2020-07-08 ENCOUNTER — Other Ambulatory Visit: Payer: Self-pay

## 2020-07-08 ENCOUNTER — Ambulatory Visit (INDEPENDENT_AMBULATORY_CARE_PROVIDER_SITE_OTHER): Payer: Medicaid Other | Admitting: Family Medicine

## 2020-07-08 VITALS — BP 100/65 | HR 64 | Ht 64.5 in | Wt 162.0 lb

## 2020-07-08 DIAGNOSIS — L905 Scar conditions and fibrosis of skin: Secondary | ICD-10-CM | POA: Insufficient documentation

## 2020-07-08 DIAGNOSIS — R52 Pain, unspecified: Secondary | ICD-10-CM | POA: Diagnosis present

## 2020-07-08 DIAGNOSIS — C07 Malignant neoplasm of parotid gland: Secondary | ICD-10-CM | POA: Diagnosis not present

## 2020-07-08 NOTE — Assessment & Plan Note (Deleted)
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  The surgery occurred in 2019.  Her swelling started about 3 days ago.  She has noticed some discomfort with swallowing in that region and can sometimes turn her head a certain way to where the swallowing is not painful.  She has not noticed any fevers, weight loss, night sweats, or other symptoms.  On physical exam she does have a small nodularity that is about 1 cm noted along the surgical scar on the left side behind her jawline.  Discussed with her that I recommend she follows up with her ENT surgeon to determine what if any imaging should be done to evaluate the area.  She plans to reach out to them today to schedule follow-up.  She will let us know if we can help in any other way.

## 2020-07-08 NOTE — Assessment & Plan Note (Signed)
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  The surgery occurred in 2019.  Her swelling started about 3 days ago.  She has noticed some discomfort with swallowing in that region and can sometimes turn her head a certain way to where the swallowing is not painful.  She has not noticed any fevers, weight loss, night sweats, or other symptoms.  On physical exam she does have a small nodularity that is about 1 cm noted along the surgical scar on the left side behind her jawline.  Discussed with her that I recommend she follows up with her ENT surgeon to determine what if any imaging should be done to evaluate the area.  She plans to reach out to them today to schedule follow-up.  She will let us know if we can help in any other way.

## 2020-07-08 NOTE — Addendum Note (Signed)
Addended by: Lurline Del on: 07/08/2020 02:12 PM   Modules accepted: Miquel Dunn

## 2020-07-08 NOTE — Patient Instructions (Signed)
It was great to see you! Thank you for allowing me to participate in your care!  Our plans for today:  -We spoke with one of the senior physicians who recommended having you go back to ENT to determine which scan would be best to evaluate the area.  Then I determined that imaging is not needed but then I also determined that you should get a CT or other imaging.  If you need anything from [place] please let us know.  Take care and seek immediate care sooner if you develop any concerns.   Dr. Lurline Del, [place]

## 2020-07-08 NOTE — Progress Notes (Signed)
    SUBJECTIVE:   CHIEF COMPLAINT / HPI:   Pain in left side of neck: 35 year old female presenting with pain on the left side of her neck for 2 or 3 days. She states two or 3 days ago she noticed swelling at the site of her surgery and difficulty swallowing. She then noticed it was a bit better for about a day and then worse last night. She states she can swallow food and fluids but it is painful in that side of the neck when swallowing and she can move her head a certain way when swallowing to help the pain. No fevers, unintentional weight loss, night sweats, or other symptoms.  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  She does have a 1 cm nodularity which has pain with palpation in the center of that scar.  When she opens her mouth or turns her head this becomes less prominent suggesting it may be against some muscle or other underlying tissue. Cardiac: RRR, no murmurs. 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  The surgery occurred in 2019.  Her swelling started about 3 days ago.  She has noticed some discomfort with swallowing in that region and can sometimes turn her head a certain way to where the swallowing is not painful.  She has not noticed any fevers, weight loss, night sweats, or other symptoms.  On physical exam she does have a small nodularity that is about 1 cm  noted along the surgical scar on the left side behind her jawline.  Discussed with her that I recommend she follows up with her ENT surgeon to determine what if any imaging should be done to evaluate the area.  She plans to reach out to them today to schedule follow-up.  She will let us know if we can help in any other way.    Lurline Del, [place]

## 2021-01-09 DIAGNOSIS — Z419 Encounter for procedure for purposes other than remedying health state, unspecified: Secondary | ICD-10-CM | POA: Diagnosis not present

## 2021-02-09 DIAGNOSIS — Z419 Encounter for procedure for purposes other than remedying health state, unspecified: Secondary | ICD-10-CM | POA: Diagnosis not present

## 2021-03-09 DIAGNOSIS — Z419 Encounter for procedure for purposes other than remedying health state, unspecified: Secondary | ICD-10-CM | POA: Diagnosis not present

## 2021-03-21 DIAGNOSIS — C07 Malignant neoplasm of parotid gland: Secondary | ICD-10-CM | POA: Diagnosis not present

## 2021-03-21 DIAGNOSIS — Z483 Aftercare following surgery for neoplasm: Secondary | ICD-10-CM | POA: Diagnosis not present

## 2021-04-07 ENCOUNTER — Encounter: Payer: Medicaid Other | Admitting: Family Medicine

## 2021-04-07 ENCOUNTER — Ambulatory Visit (INDEPENDENT_AMBULATORY_CARE_PROVIDER_SITE_OTHER): Payer: Medicaid Other | Admitting: Student

## 2021-04-07 ENCOUNTER — Encounter: Payer: Self-pay | Admitting: Student

## 2021-04-07 VITALS — BP 127/86 | HR 78 | Ht 64.0 in | Wt 145.1 lb

## 2021-04-07 DIAGNOSIS — M7712 Lateral epicondylitis, left elbow: Secondary | ICD-10-CM | POA: Diagnosis not present

## 2021-04-07 DIAGNOSIS — M7711 Lateral epicondylitis, right elbow: Secondary | ICD-10-CM

## 2021-04-07 NOTE — Patient Instructions (Signed)
It was great to see you! Thank you for allowing me to participate in your care! ? ?It looks like you have tennis elbow, from repetitive use of your arms. It's irritation or inflammation of the tendons that attache to your outer elbow. This is best treated with rest! ? ?Our plans for today:  ?- Tennis elbow ?    - Rest, avoid provoking activities ?    - Use brace, during activity, not while sleeping. See picture below!  S[employer] broker for fore arm strap, Tennis Elbow Brace) ? ? ?   -Topical pain cream/ointment to area ?       -Voltaren gel, Salonpass ? ? ? ?   - Icing area after use, 20 min at a time ?   - Ibuprofen / Tylenol as needed ?   - Consider PT if no improvement ? ?Take care and seek immediate care sooner if you develop any concerns.  ? ?Dr. Holley Bouche, MD ?[place] ? ?

## 2021-04-07 NOTE — Assessment & Plan Note (Signed)
Patient presents with constant aching pain in elbow/forearm area bilaterally. Patient has had pain for months, and it started after she began working a new job, requiring her to move heavy boxes. Pain limited to muscles with no bony tenderness. Pain greatest with dorsiflexion of hand and focused on lateral epicondyle, no pain with passive movement of extremity. On exam bulk, and tone is symmetric, with no edema or effusion appreciated. Symptoms and history are most concerning for tennis elbow. Given that pain is symmetric and muscular, less concerning for arthritic process. Given focal location of pain, less likely golfer's elbow. Will recommend rest, topical analgesia, and forearm bracing with use. ?-Rest arm/limit use as possible ?-Forearm brace with activity ?-Topical analgesia (voltaren gel) ?-Oral analgesia with NSAID or Tyl ?-Ice after activity ?-Consider PT if no improvement ?

## 2021-04-07 NOTE — Progress Notes (Signed)
? ? ?  SUBJECTIVE:  ? ?Chief compliant/HPI: annual examination ? ?Kathryn Barnes is a 36 y.o. who presents today for an annual exam.  ? ?Elbow pain: ?Recently restarted work. Pain in lower arms, up to elbows. Sometimes pain is located deep to elbow joint and spreading down to thumb/pinky. Is worse when gripping/picking up things. Feels like a sore muscle that wont stop being sore. Described pain is achey. Pain started at in January, maybe 2 weeks after starting working. 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 Also feels kinda numb. Pain is always hurting, even if not using muscles.  ? ?OBJECTIVE:  ? ?BP 127/86   Pulse 78   Ht '5\' 4"'$  (1.626 m)   Wt 145 lb 2 oz (65.8 kg)   LMP 02/15/2021   SpO2 100%   BMI 24.91 kg/m?   ? ?Physical Exam ?Constitutional:   ?   Appearance: Normal appearance.  ?Musculoskeletal:  ?   Right forearm: Tenderness present. No swelling, edema, deformity, lacerations or bony tenderness.  ?   Left forearm: Tenderness present. No swelling, edema, deformity, lacerations or bony tenderness.  ?   Comments: Bulk and tone of muscles symmetric, strength intact, pain with dosrsiflexion of hands. Pain focused around lateral epicondyle  ?Neurological:  ?   Mental Status: She is alert.  ? ? ? ?ASSESSMENT/PLAN:  ? ?Lateral epicondylitis of both elbows ?Patient presents with constant aching pain in elbow/forearm area bilaterally. Patient has had pain for months, and it started after she began working a new job, requiring her to move heavy boxes. Pain limited to muscles with no bony tenderness. Pain greatest with dorsiflexion of hand and focused on lateral epicondyle, no pain with passive movement of extremity. On exam bulk, and tone is symmetric, with no edema or effusion appreciated. Symptoms and history are most concerning for tennis elbow. Given that pain is symmetric and muscular, less concerning for arthritic process. Given focal location of pain, less likely golfer's elbow.  Will recommend rest, topical analgesia, and forearm bracing with use. ?-Rest arm/limit use as possible ?-Forearm brace with activity ?-Topical analgesia (voltaren gel) ?-Oral analgesia with NSAID or Tyl ?-Ice after activity ?-Consider PT if no improvement ?  ? ? ?Holley Bouche, MD ?[place]  ? ?

## 2021-04-09 DIAGNOSIS — Z419 Encounter for procedure for purposes other than remedying health state, unspecified: Secondary | ICD-10-CM | POA: Diagnosis not present

## 2021-04-27 DIAGNOSIS — G510 Bell's palsy: Secondary | ICD-10-CM | POA: Diagnosis not present

## 2021-04-27 DIAGNOSIS — H9012 Conductive hearing loss, unilateral, left ear, with unrestricted hearing on the contralateral side: Secondary | ICD-10-CM | POA: Diagnosis not present

## 2021-04-27 DIAGNOSIS — Z923 Personal history of irradiation: Secondary | ICD-10-CM | POA: Diagnosis not present

## 2021-04-27 DIAGNOSIS — Z85818 Personal history of malignant neoplasm of other sites of lip, oral cavity, and pharynx: Secondary | ICD-10-CM | POA: Diagnosis not present

## 2021-04-27 DIAGNOSIS — Z9889 Other specified postprocedural states: Secondary | ICD-10-CM | POA: Diagnosis not present

## 2021-04-27 DIAGNOSIS — C07 Malignant neoplasm of parotid gland: Secondary | ICD-10-CM | POA: Diagnosis not present

## 2021-04-27 DIAGNOSIS — H61302 Acquired stenosis of left external ear canal, unspecified: Secondary | ICD-10-CM | POA: Diagnosis not present

## 2021-04-28 DIAGNOSIS — Z113 Encounter for screening for infections with a predominantly sexual mode of transmission: Secondary | ICD-10-CM | POA: Diagnosis not present

## 2021-04-28 DIAGNOSIS — Z01411 Encounter for gynecological examination (general) (routine) with abnormal findings: Secondary | ICD-10-CM | POA: Diagnosis not present

## 2021-05-09 DIAGNOSIS — Z419 Encounter for procedure for purposes other than remedying health state, unspecified: Secondary | ICD-10-CM | POA: Diagnosis not present

## 2021-06-09 DIAGNOSIS — Z419 Encounter for procedure for purposes other than remedying health state, unspecified: Secondary | ICD-10-CM | POA: Diagnosis not present

## 2021-06-14 ENCOUNTER — Encounter: Payer: Self-pay | Admitting: *Deleted

## 2021-07-09 DIAGNOSIS — Z419 Encounter for procedure for purposes other than remedying health state, unspecified: Secondary | ICD-10-CM | POA: Diagnosis not present

## 2021-08-09 DIAGNOSIS — Z419 Encounter for procedure for purposes other than remedying health state, unspecified: Secondary | ICD-10-CM | POA: Diagnosis not present

## 2021-08-31 IMAGING — US US MFM UA CORD DOPPLER
1 series · 14 of 28 positions shown · non-contrast
Comparison: none

[Series 1: us mfm ua cord doppler · 34 acquisitions, 14 frames shown]
[im 2/34]
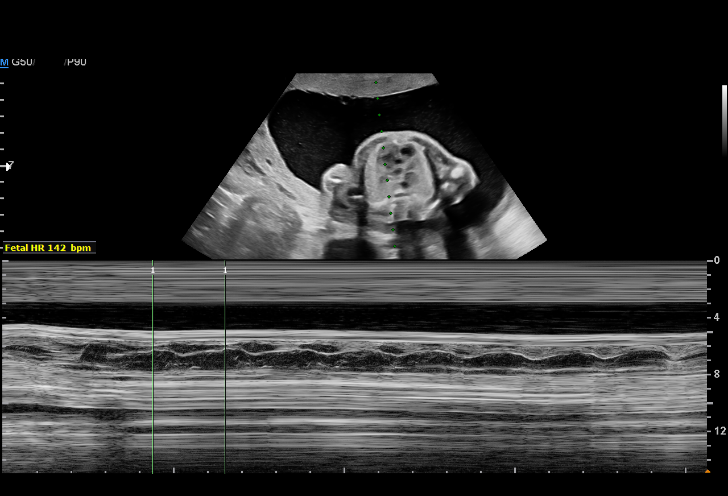
[im 4/34]
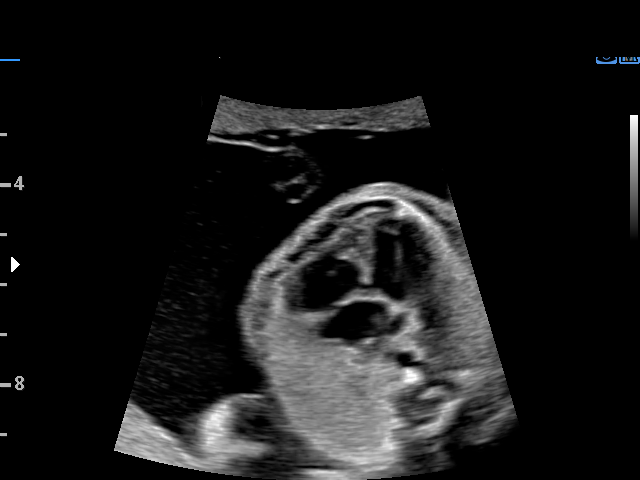
[im 7/34]
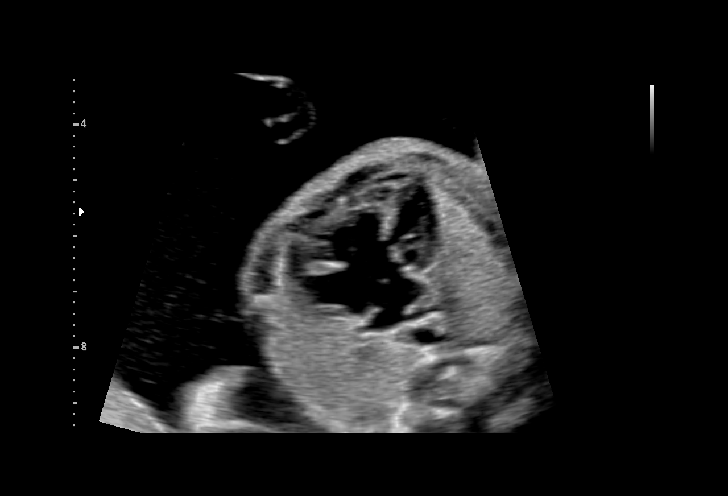
[im 9/34]
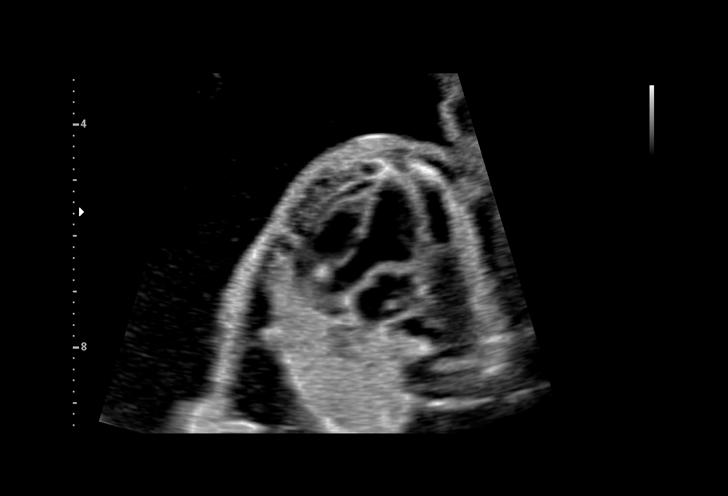
[im 12/34]
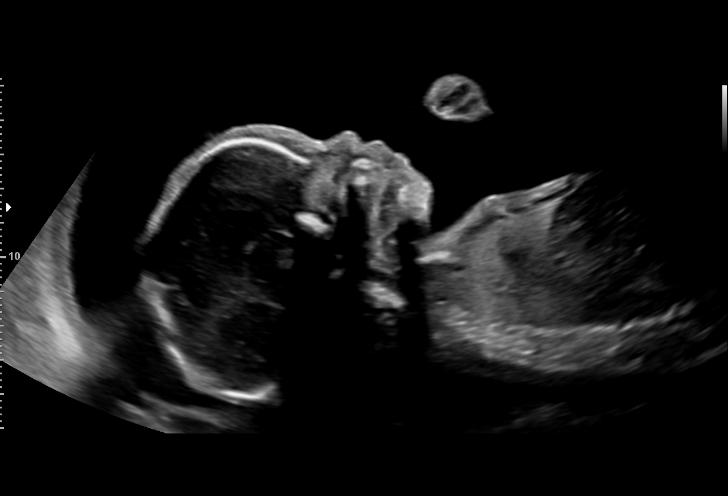
[im 14/34]
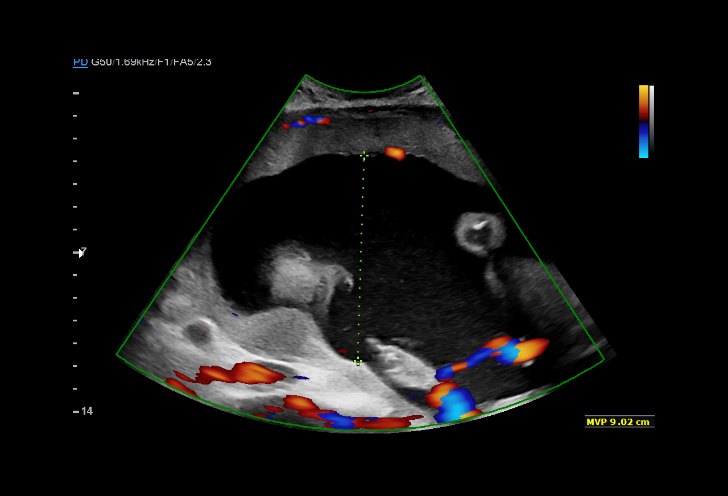
[im 16/34]
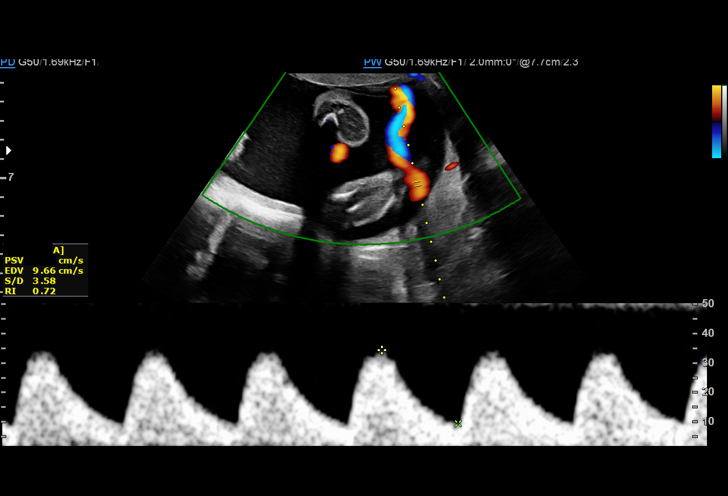
[im 19/34]
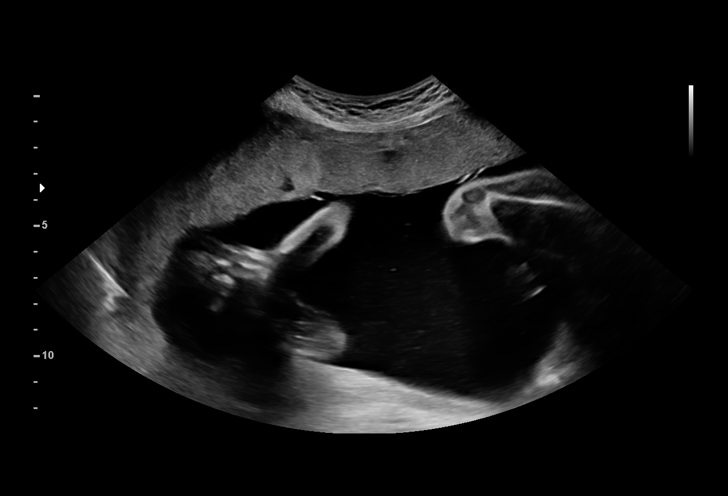
[im 21/34]
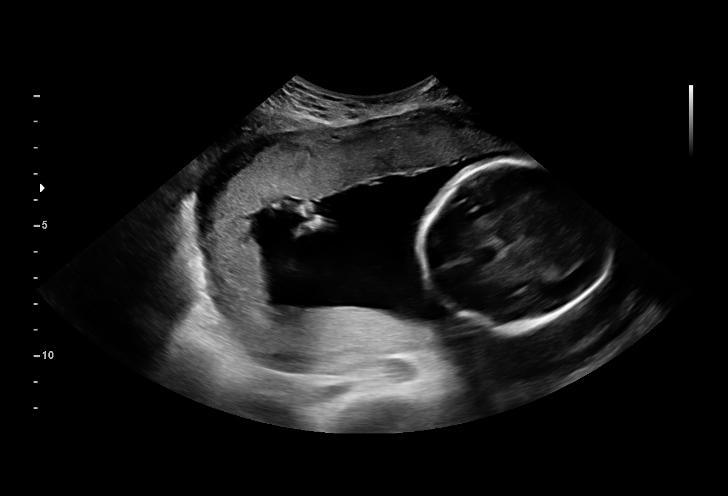
[im 24/34]
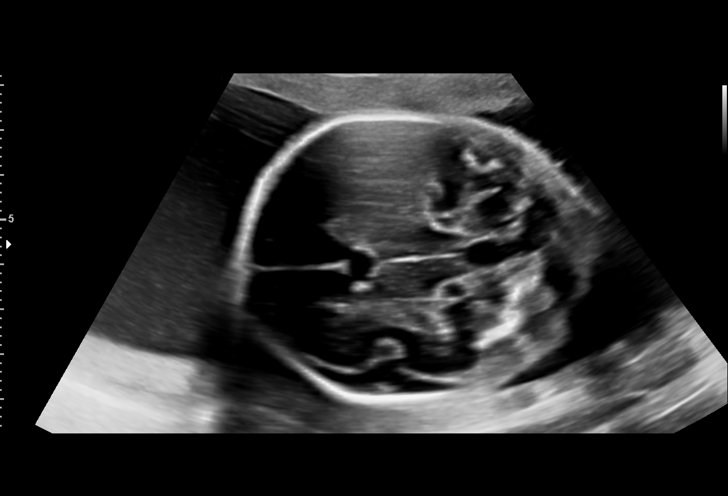
[im 26/34]
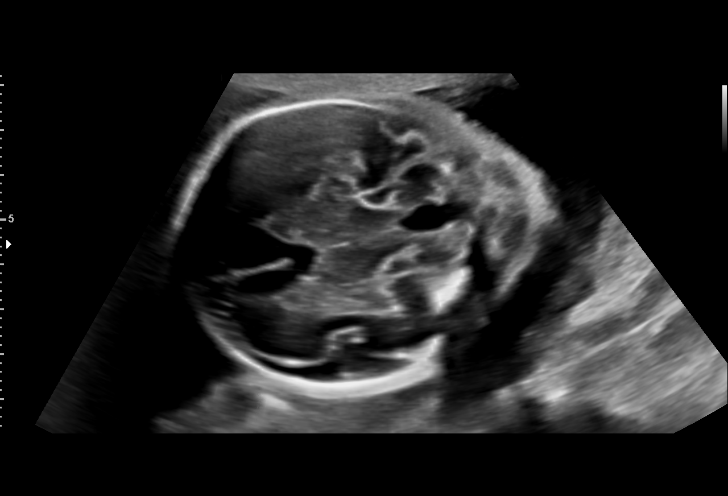
[im 29/34]
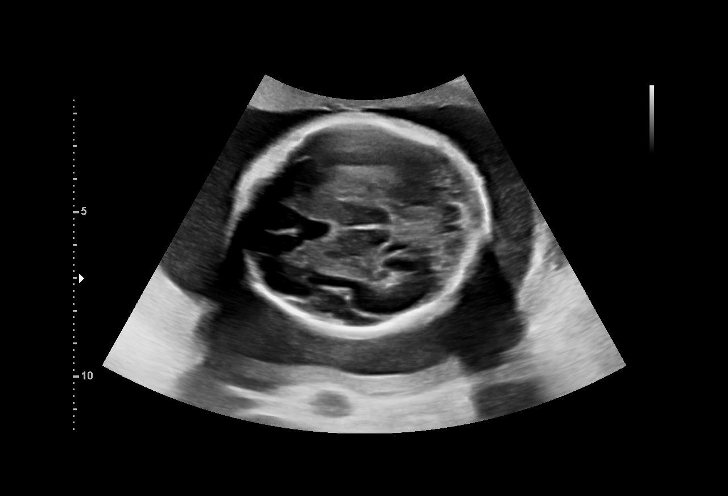
[im 31/34]
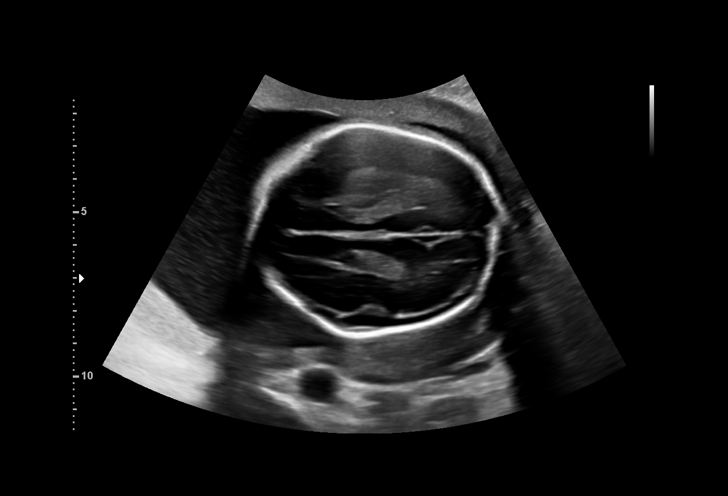
[im 34/34]
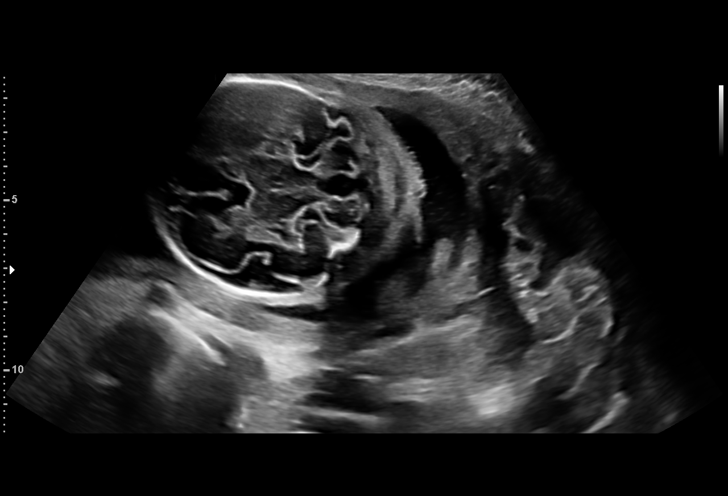

[14 of 28 positions shown; findings below may reference images not displayed]

----------------------------------------------------------------------

 ----------------------------------------------------------------------
Indications

  Poor obstetric history: Previous
  preeclampsia / eclampsia/gestational HTN
  (ASA)
  Late prenatal care, second trimester
  Fetal abnormality - other known or
  suspected (fetal heart defect)
  27 weeks gestation of pregnancy
 ----------------------------------------------------------------------
Vital Signs

 BMI:
Fetal Evaluation

 Num Of Fetuses:         1
 Fetal Heart Rate(bpm):  142
 Cardiac Activity:       Observed
 Presentation:           Breech
 Placenta:               Anterior
 P. Cord Insertion:      Previously Visualized

 Amniotic Fluid
 AFI FV:      Polyhydramnios

                             Largest Pocket(cm)

OB History
 Gravidity:    2         Term:   1        Prem:   0        SAB:   0
 TOP:          0       Ectopic:  0        Living: 1
Gestational Age

 LMP:           27w 0d        Date:  05/10/18                 EDD:   02/14/19
 Best:          27w 0d     Det. By:  LMP  (05/10/18)          EDD:   02/14/19
Anatomy

 Cerebellum:            Abnormal, see          Stomach:                Appears normal, left
                        comments
                                                                       sided
 Face:                  Absent nasal bone      Bladder:                Appears normal
 Heart:                 Abnormal, see
                        comments
Doppler - Fetal Vessels

 Umbilical Artery
  S/D     %tile
 4.03       87

Cervix Uterus Adnexa

 Cervix
 Not visualized (advanced GA >45wks)
Impression

 Ms. Reant with fetal cardiac anomaly and fetal growth
 restriction diagnosed on previous ultrasound return for
 umbilical artery Doppler studies and NST.  She had fetal
 echocardiography that confirmed complete AV canal defect
 (Dr. Sebastian).

 Patient opted not to have cell free fetal DNA screening or
 amniocentesis.

 On ultrasound, amniotic fluid is slightly increased.  Umbilical
 artery Doppler study showed normal forward diastolic flow.  A
 small posterior fossa cyst is seen having an hourglass
 appearance.  Vermis seems to be pushed upward.  Lateral
 ventricular measurements are normal and cerebellum
 appears otherwise normal.  No other intracranial
 abnormalities are seen.
 NST is reactive for this gestational age.

 I explained the findings suggestive of Trevis Era cyst.  It
 is a persistence of an embryological structure and can be
 associated with Down syndrome or other chromosomal
 abnormalities.  If it is an isolated finding normal postnatal
 development is expected. Differential diagnosis include
 Dandy-Walker syndrome.

 Based on this finding and the fetal cardiac anomaly, I have
 recommended amniocentesis for fetal karyotype.  I also
 discussed fetal brain MRI to confirm the diagnosis.

 Patient will bring her partner on her next visit and will make a
 joint decision on amniocentesis and fetal MRI.
Recommendations

 -UA Doppler and NST next week.
 -Fetal growth assessment in 2 weeks.
                      Melenhorst, Alexandru Marius

## 2021-09-09 DIAGNOSIS — Z419 Encounter for procedure for purposes other than remedying health state, unspecified: Secondary | ICD-10-CM | POA: Diagnosis not present

## 2021-09-18 IMAGING — US US MFM UA CORD DOPPLER
1 series · 14 of 28 positions shown · non-contrast
Comparison: none

[Series 1: us mfm ua cord doppler · 59 acquisitions, 14 frames shown]
[im 3/59]
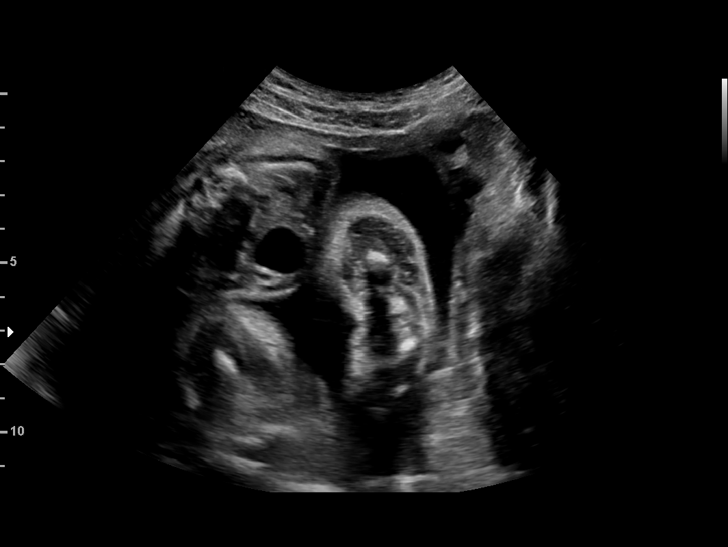
[im 7/59]
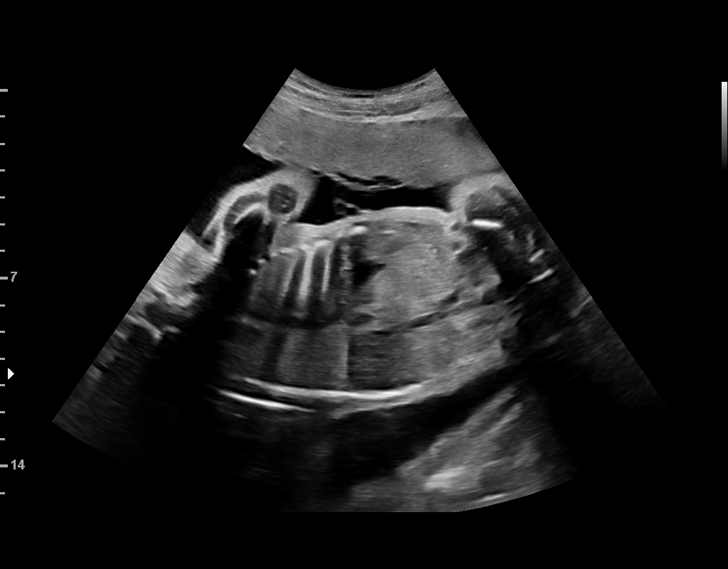
[im 11/59]
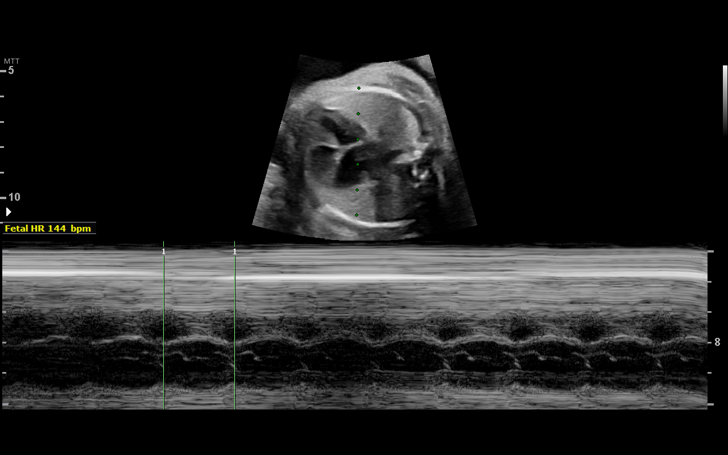
[im 16/59]
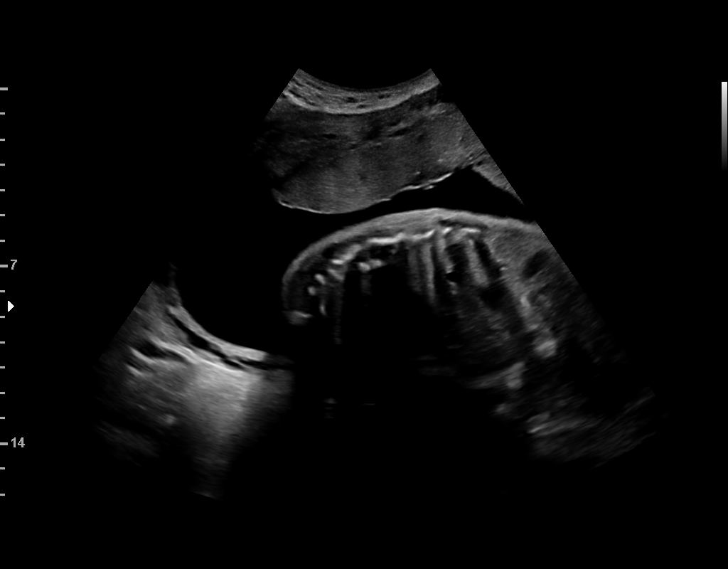
[im 20/59]
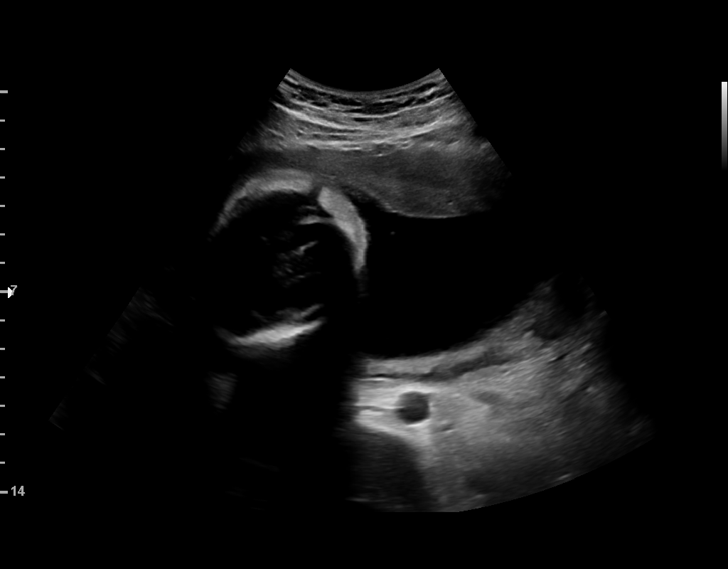
[im 24/59]
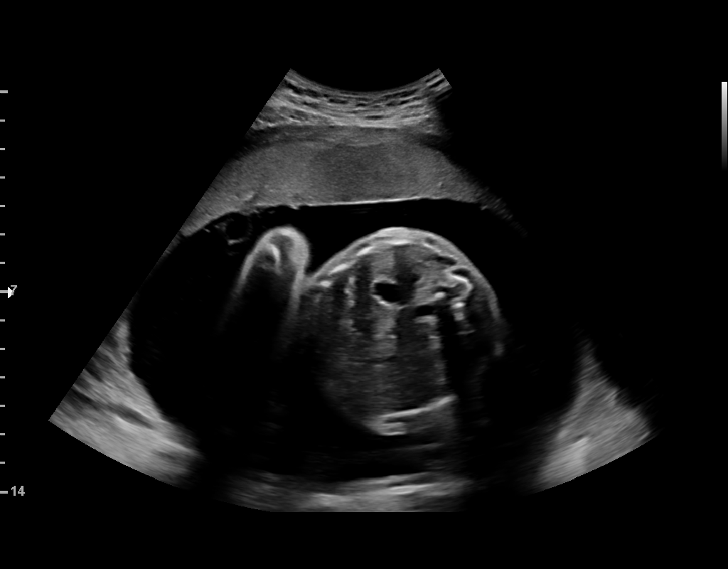
[im 28/59]
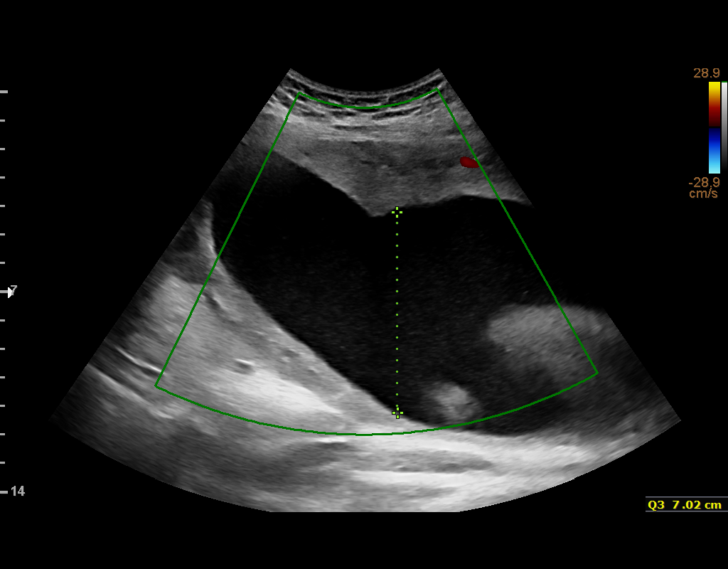
[im 33/59]
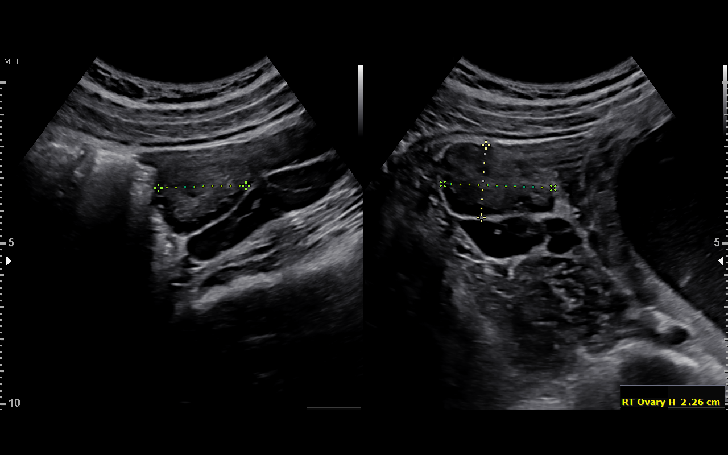
[im 37/59]
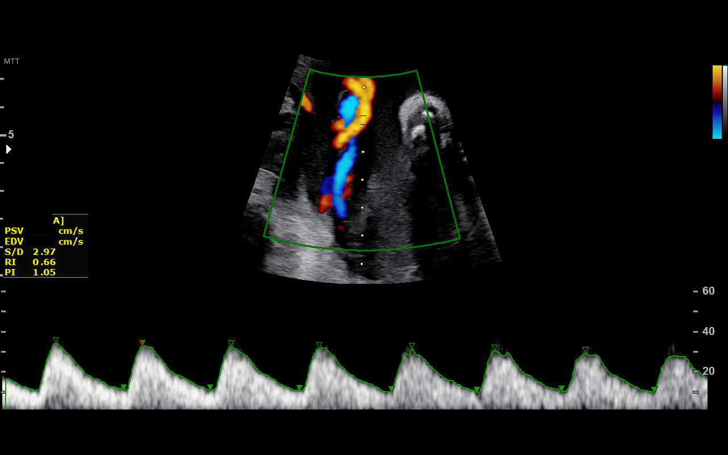
[im 41/59]
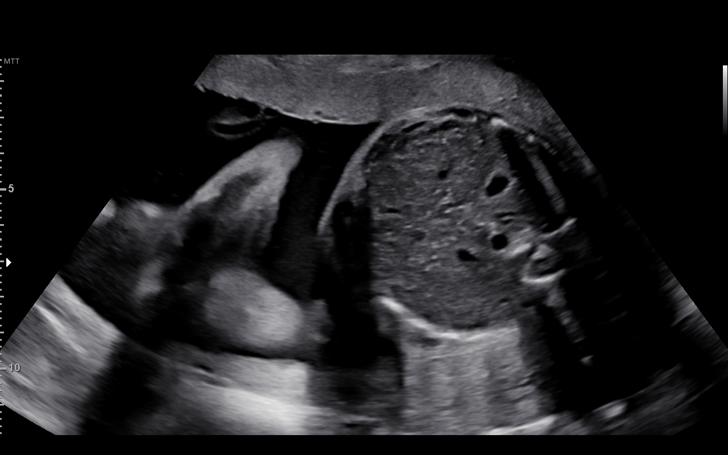
[im 46/59]
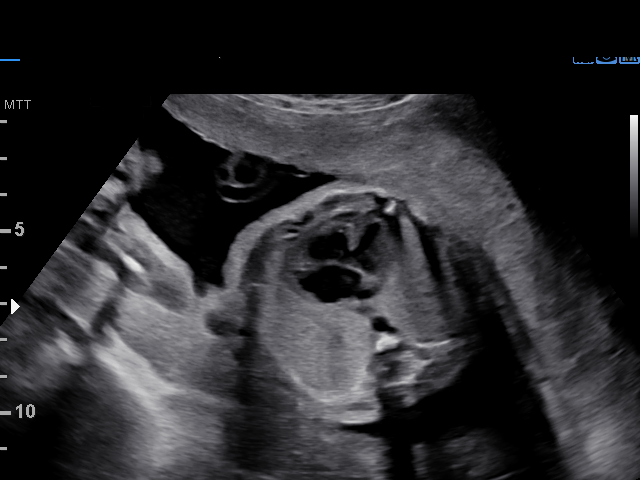
[im 50/59]
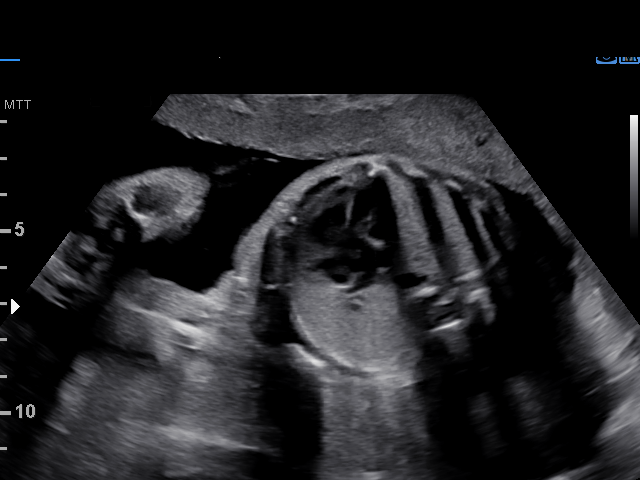
[im 54/59]
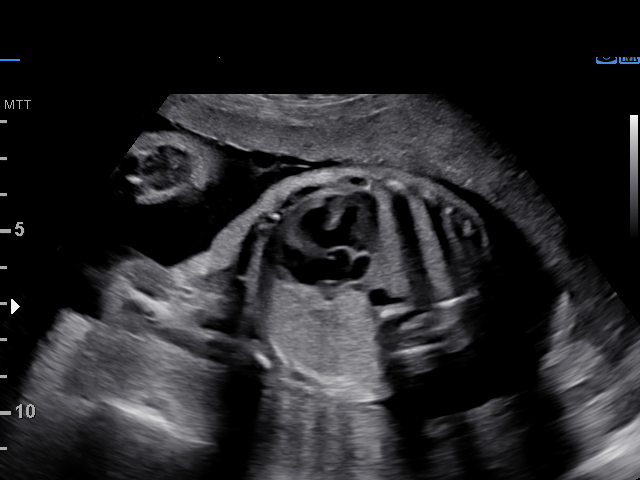
[im 59/59]
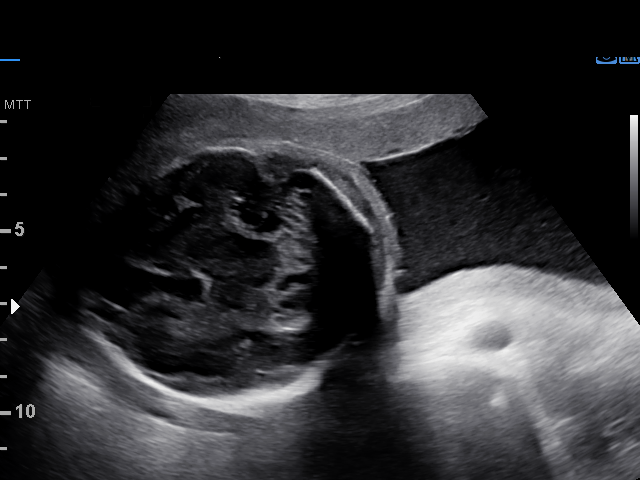

[14 of 28 positions shown; findings below may reference images not displayed]

----------------------------------------------------------------------

 ----------------------------------------------------------------------
Indications

  Maternal care for known or suspected poor
  fetal growth, third trimester, fetus 1 IUGR
  Fetal abnormality - other known or
  suspected (fetal heart defect)
  Polyhydramnios, third trimester, antepartum
  condition or complication, unspecified fetus
  Poor obstetric history: Previous
  preeclampsia / eclampsia/gestational HTN
  (ASA)
  Late to prenatal care, third trimester
  29 weeks gestation of pregnancy
 ----------------------------------------------------------------------
Vital Signs

                                                Height:        5'4"
Fetal Evaluation

 Num Of Fetuses:         1
 Fetal Heart Rate(bpm):  144
 Cardiac Activity:       Observed
 Presentation:           Breech
 Placenta:               Anterior
 P. Cord Insertion:      Visualized

 Amniotic Fluid
 AFI FV:      Polyhydramnios

 AFI Sum(cm)     %Tile       Largest Pocket(cm)
 24.99           96
 RUQ(cm)       RLQ(cm)       LUQ(cm)        LLQ(cm)

Biometry

 LV:        3.4  mm
OB History

 Gravidity:    2         Term:   1        Prem:   0        SAB:   0
 TOP:          0       Ectopic:  0        Living: 1
Gestational Age

 LMP:           29w 4d        Date:  05/10/18                 EDD:   02/14/19
 Best:          29w 4d     Det. By:  LMP  (05/10/18)          EDD:   02/14/19
Doppler - Fetal Vessels

 Umbilical Artery
  S/D     %tile     RI              PI                     ADFV    RDFV
 2.92       53   0.66             1.01                        No      No

Cervix Uterus Adnexa

 Cervix
 Not visualized (advanced GA >48wks)

 Uterus
 No abnormality visualized.

 Left Ovary
 Within normal limits. No adnexal mass visualized.

 Right Ovary
 Within normal limits. No adnexal mass visualized.

 Cul De Sac
 No free fluid seen.

 Adnexa
 No abnormality visualized.
Comments

 This patient was seen due to an IUGR fetus.  The fetus has a
 confirmed AV canal defect and a cystic structure noted in the
 posterior fossa.  She denies any problems since her last
 exam.
 Polyhydramnios continues to be noted on today ultrasound.
 A normal-appearing stomach bubble was visualized today.
 The AV canal defect is noted again today.
 The cystic structure in the posterior fossa continues to be
 noted.  The patient was advised that the cystic structure in
 the posterior fossa may be a normal variant or it may
 represent Vernice Mekonnen cyst.
 Doppler studies of the umbilical arteries performed due to
 fetal growth restriction showed a normal S/D ratio of 2.92.
 Vigorous fetal movements were noted throughout today's
 ultrasound exam.
 A follow-up exam was scheduled in 1 week.

## 2021-09-26 DIAGNOSIS — Z9009 Acquired absence of other part of head and neck: Secondary | ICD-10-CM | POA: Diagnosis not present

## 2021-09-26 DIAGNOSIS — C07 Malignant neoplasm of parotid gland: Secondary | ICD-10-CM | POA: Diagnosis not present

## 2021-09-26 DIAGNOSIS — H61302 Acquired stenosis of left external ear canal, unspecified: Secondary | ICD-10-CM | POA: Diagnosis not present

## 2021-09-26 DIAGNOSIS — Z85858 Personal history of malignant neoplasm of other endocrine glands: Secondary | ICD-10-CM | POA: Diagnosis not present

## 2021-09-26 DIAGNOSIS — Z09 Encounter for follow-up examination after completed treatment for conditions other than malignant neoplasm: Secondary | ICD-10-CM | POA: Diagnosis not present

## 2021-10-04 ENCOUNTER — Ambulatory Visit (INDEPENDENT_AMBULATORY_CARE_PROVIDER_SITE_OTHER): Payer: Medicaid Other | Admitting: Student

## 2021-10-04 VITALS — BP 133/89 | HR 68 | Ht 64.0 in | Wt 144.6 lb

## 2021-10-04 DIAGNOSIS — F419 Anxiety disorder, unspecified: Secondary | ICD-10-CM | POA: Diagnosis not present

## 2021-10-04 IMAGING — US US MFM UA CORD DOPPLER
1 series · 14 of 28 positions shown · non-contrast
Comparison: none

[Series 1: us mfm ua cord doppler · 43 acquisitions, 14 frames shown]
[im 2/43]
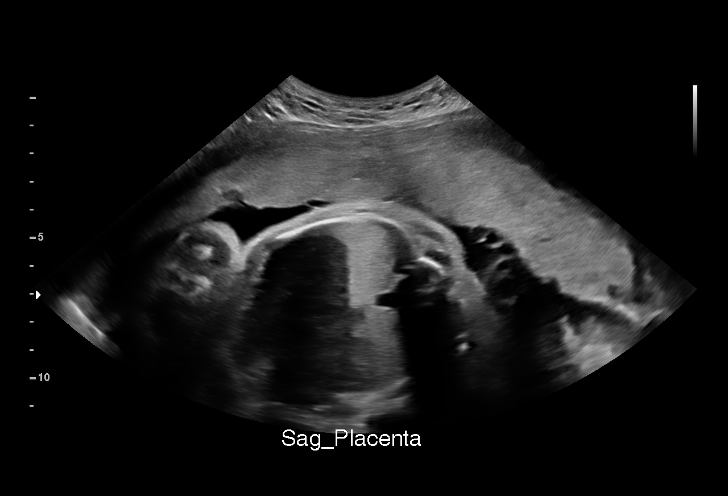
[im 5/43]
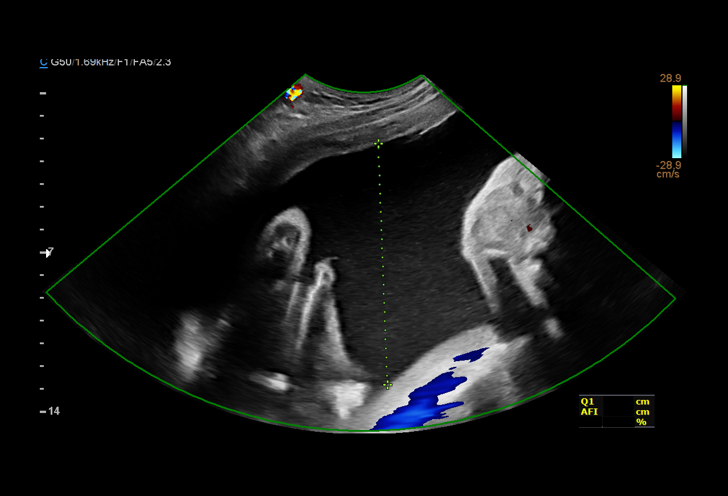
[im 8/43]
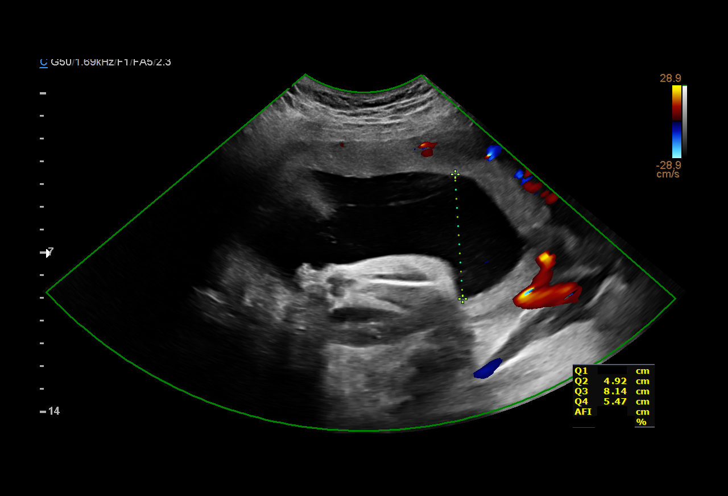
[im 11/43]
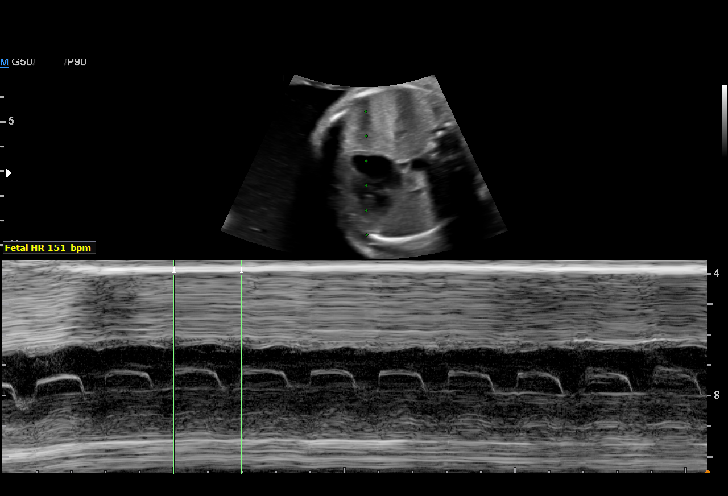
[im 15/43]
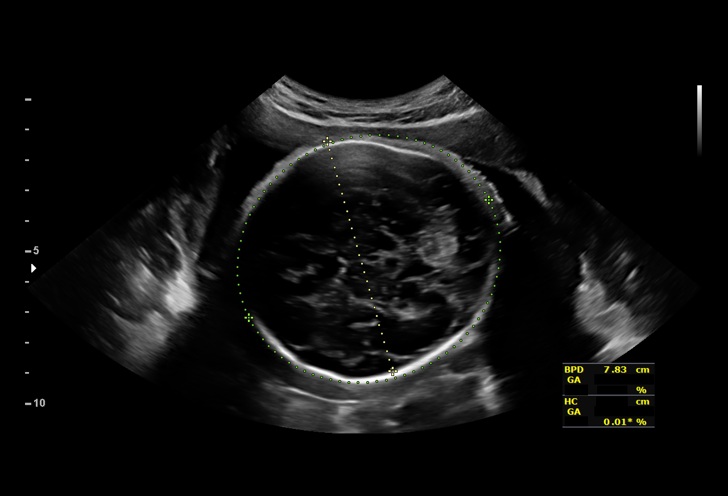
[im 18/43]
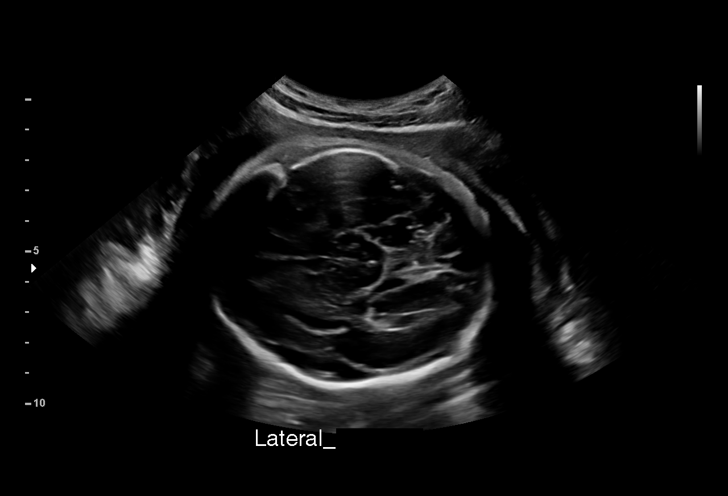
[im 21/43]
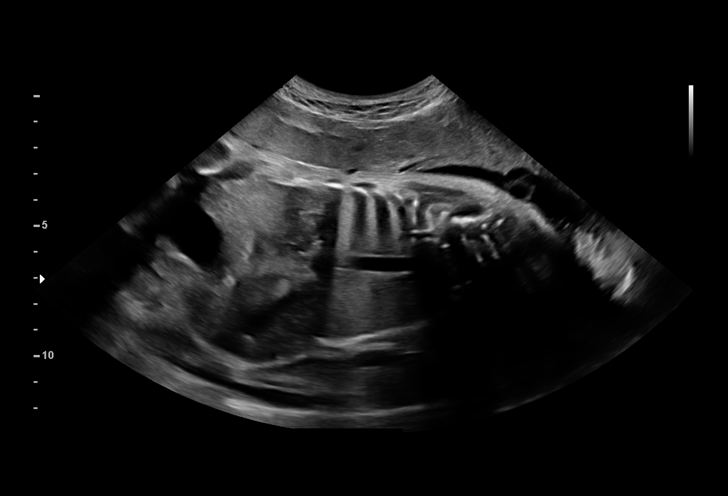
[im 24/43]
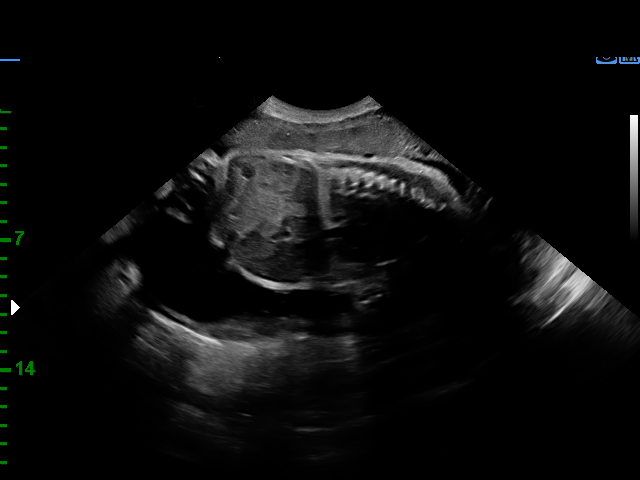
[im 27/43]
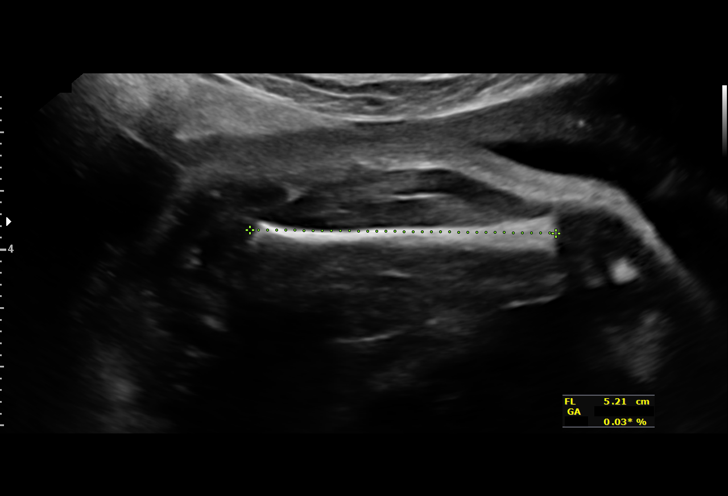
[im 30/43]
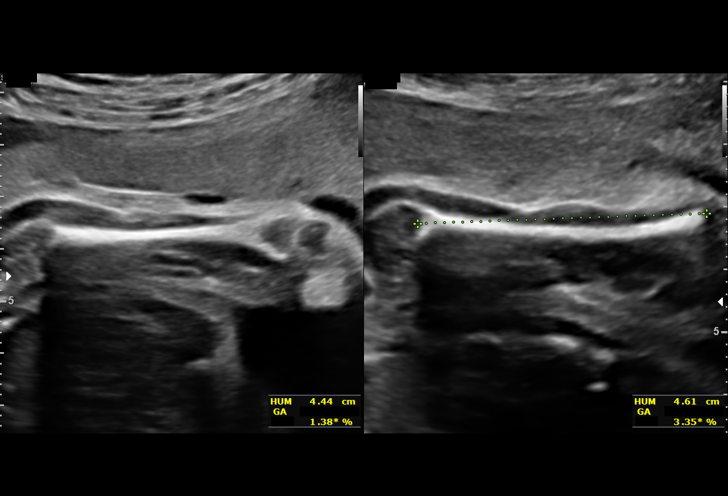
[im 33/43]
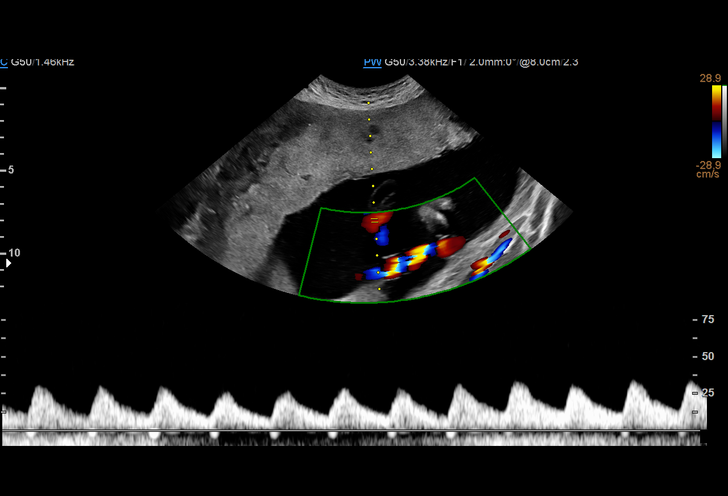
[im 36/43]
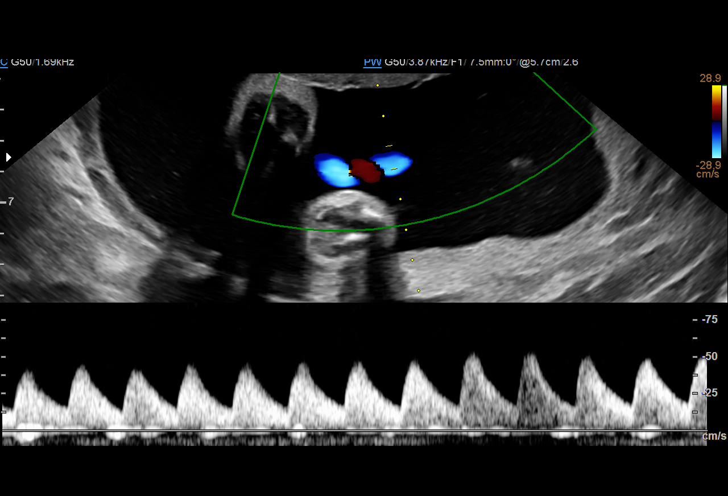
[im 39/43]
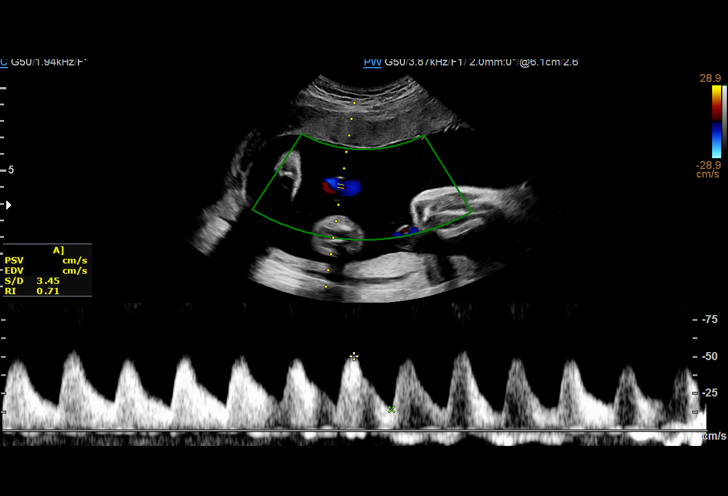
[im 43/43]
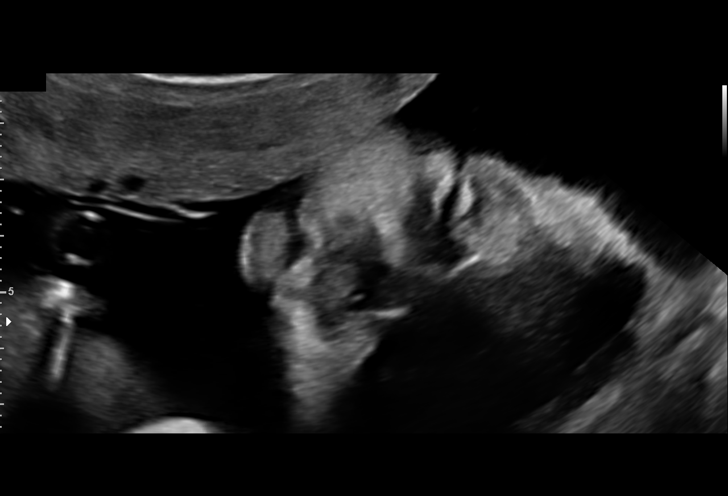

[14 of 28 positions shown; findings below may reference images not displayed]

----------------------------------------------------------------------

 ----------------------------------------------------------------------
Indications

  Maternal care for known or suspected poor
  fetal growth, third trimester, fetus 1 IUGR
  Fetal abnormality - other known or
  suspected (fetal heart defect)
  Polyhydramnios, third trimester, antepartum
  condition or complication, unspecified fetus
  Poor obstetric history: Previous
  preeclampsia / eclampsia/gestational HTN
  (ASA)
  Late to prenatal care, third trimester
  31 weeks gestation of pregnancy
 ----------------------------------------------------------------------
Vital Signs

                                                Height:        5'4"
Fetal Evaluation

 Num Of Fetuses:         1
 Fetal Heart Rate(bpm):  151
 Cardiac Activity:       Observed

 Amniotic Fluid
 AFI FV:      Polyhydramnios

 AFI Sum(cm)     %Tile       Largest Pocket(cm)
 29.12           > 97
 RUQ(cm)       RLQ(cm)       LUQ(cm)        LLQ(cm)

Biophysical Evaluation

 Amniotic F.V:   Within normal limits       F. Tone:        Observed
 F. Movement:    Observed                   Score:          [DATE]
 F. Breathing:   Observed
Biometry

 BPD:      77.8  mm     G. Age:  31w 2d         22  %    CI:        82.89   %    70 - 86
                                                         FL/HC:      19.2   %    19.1 -
 HC:      269.5  mm     G. Age:  29w 3d        < 1  %    HC/AC:      1.02        0.96 -
 AC:       263   mm     G. Age:  30w 3d         12  %    FL/BPD:     66.5   %    71 - 87
 FL:       51.7  mm     G. Age:  27w 4d        < 1  %    FL/AC:      19.7   %    20 - 24
 HUM:      45.2  mm     G. Age:  26w 5d        < 5  %

 LV:          3  mm

 Est. FW:    1666  gm      3 lb 1 oz      2  %
OB History

 Gravidity:    2         Term:   1        Prem:   0        SAB:   0
 TOP:          0       Ectopic:  0        Living: 1
Gestational Age

 LMP:           31w 6d        Date:  05/10/18                 EDD:   02/14/19
 U/S Today:     29w 5d                                        EDD:   03/01/19
 Best:          31w 6d     Det. By:  LMP  (05/10/18)          EDD:   02/14/19
Doppler - Fetal Vessels

 Umbilical Artery
  S/D     %tile                                            ADFV    RDFV
 2.23       21                                                No      No

Impression

 Severe fetal growth restriction.  AV canal defect confirmed by
 fetal echocardiography.  Suspected fetal Down syndrome.

 Mild polyhydramnios is seen.  Good fetal activity is present.
 The estimated fetal weight is at the 2nd percentile.  Interval
 weight gain is 532 g over 3 weeks.Amniotic fluid is normal
 and good fetal activity is seen. Antenatal testing is
 reassuring. BPP [DATE]. Umbilical artery Doppler showed normal
 forward diastolic flow.
 BP at our office: 110/69 mm Hg.
Recommendations

 -Continue weekly BPP and Doppler.
                 Togatorop, Kiyeol

## 2021-10-04 MED ORDER — LORAZEPAM 1 MG PO TABS: 1.0000 mg | ORAL_TABLET | Freq: Four times a day (QID) | ORAL | 0 refills | Status: AC | PRN

## 2021-10-04 NOTE — Progress Notes (Signed)
    SUBJECTIVE:   CHIEF COMPLAINT / HPI:   Kathryn Barnes is a 36 year old female here to get a sedative medication for MRI study Patient report she's claustrophobic and couldn't go through the machine when the study was attempted. She had sedative medications in the past for imaging and reported no side effect. MRI is for both wrist due to tingling and numbness that started affect a work related accident.   PERTINENT  PMH / PSH: Noncontributory  OBJECTIVE:   BP 133/89   Pulse 68   Ht '5\' 4"'$  (1.626 m)   Wt 144 lb 9.6 oz (65.6 kg)   LMP 09/25/2021 (Exact Date)   SpO2 100%   BMI 24.82 kg/m    Physical Exam General: Alert, well appearing, NAD Cardiovascular: Regular rate, well perfused. Respiratory: Normal work of breathing on RA Extremities: No edema on extremities    ASSESSMENT/PLAN:   Anxiety 36 year old female who is claustrophobic is requesting medication for MRI study. Has used sedative medication in the past without side effect. -Rx PRN Ativan '1mg'$  for MRI  - Reviewed risk and benefit with patient  -Advised patient she would need to be driven to and from appointment.   Alen Bleacher, MD [place]

## 2021-10-04 NOTE — Patient Instructions (Signed)
It was wonderful to meet you today. Thank you for allowing me to be a part of your care. Below is a short summary of what we discussed at your visit today:  Have sent in prescription for Ativan which you should take for your MRI.  He is planned for someone to take you to your appointment and back for the MRI. PLEASE DO NOT DRIVE AFTER YOU'VE TAKEN THIS MEDICATION  Please bring all of your medications to every appointment!  If you have any questions or concerns, please do not hesitate to contact us via phone or MyChart message.   Alen Bleacher, MD [facility]

## 2021-10-09 DIAGNOSIS — Z419 Encounter for procedure for purposes other than remedying health state, unspecified: Secondary | ICD-10-CM | POA: Diagnosis not present

## 2021-10-13 DIAGNOSIS — Z85818 Personal history of malignant neoplasm of other sites of lip, oral cavity, and pharynx: Secondary | ICD-10-CM | POA: Diagnosis not present

## 2021-10-13 DIAGNOSIS — H9012 Conductive hearing loss, unilateral, left ear, with unrestricted hearing on the contralateral side: Secondary | ICD-10-CM | POA: Diagnosis not present

## 2021-11-08 ENCOUNTER — Other Ambulatory Visit (HOSPITAL_COMMUNITY): Payer: Self-pay | Admitting: Orthopedic Surgery

## 2021-11-08 DIAGNOSIS — M25531 Pain in right wrist: Secondary | ICD-10-CM

## 2021-11-09 DIAGNOSIS — Z419 Encounter for procedure for purposes other than remedying health state, unspecified: Secondary | ICD-10-CM | POA: Diagnosis not present

## 2021-12-09 DIAGNOSIS — Z419 Encounter for procedure for purposes other than remedying health state, unspecified: Secondary | ICD-10-CM | POA: Diagnosis not present

## 2022-01-09 DIAGNOSIS — Z419 Encounter for procedure for purposes other than remedying health state, unspecified: Secondary | ICD-10-CM | POA: Diagnosis not present

## 2022-01-17 DIAGNOSIS — M25512 Pain in left shoulder: Secondary | ICD-10-CM | POA: Diagnosis not present

## 2022-01-17 DIAGNOSIS — Y92410 Unspecified street and highway as the place of occurrence of the external cause: Secondary | ICD-10-CM | POA: Diagnosis not present

## 2022-01-17 DIAGNOSIS — Z041 Encounter for examination and observation following transport accident: Secondary | ICD-10-CM | POA: Diagnosis not present

## 2022-01-17 DIAGNOSIS — Y999 Unspecified external cause status: Secondary | ICD-10-CM | POA: Diagnosis not present

## 2022-01-25 DIAGNOSIS — Z9621 Cochlear implant status: Secondary | ICD-10-CM | POA: Diagnosis not present

## 2022-01-25 DIAGNOSIS — Z45321 Encounter for adjustment and management of cochlear device: Secondary | ICD-10-CM | POA: Diagnosis not present

## 2022-02-09 DIAGNOSIS — Z419 Encounter for procedure for purposes other than remedying health state, unspecified: Secondary | ICD-10-CM | POA: Diagnosis not present

## 2022-02-21 ENCOUNTER — Ambulatory Visit (INDEPENDENT_AMBULATORY_CARE_PROVIDER_SITE_OTHER): Payer: Medicaid Other | Admitting: Family Medicine

## 2022-02-21 ENCOUNTER — Ambulatory Visit: Payer: Medicaid Other | Admitting: Student

## 2022-02-21 ENCOUNTER — Other Ambulatory Visit: Payer: Self-pay

## 2022-02-21 ENCOUNTER — Encounter: Payer: Self-pay | Admitting: Family Medicine

## 2022-02-21 VITALS — BP 117/72 | HR 82 | Ht 64.0 in | Wt 158.2 lb

## 2022-02-21 DIAGNOSIS — R221 Localized swelling, mass and lump, neck: Secondary | ICD-10-CM | POA: Diagnosis not present

## 2022-02-21 NOTE — Progress Notes (Signed)
    SUBJECTIVE:   CHIEF COMPLAINT / HPI:   Kathryn Barnes is a 37 y.o. female who presents to the [facility] clinic today to discuss the following concerns:   Neck Pain/Swelling Noticed that the right-side of her face started to swell a couple of days ago. It is painful, she had to take Tylenol. No fevers. It is not painful to swallow but it is sore. Her left eye normally waters since surgery.  She is unsure if this feels similar to when she had her malignancy discovered.  She notes that initially her malignancy was discovered by her dentist who noticed abnormality and advised her to see an ENT specialist.  She does report feeling of fullness in the past that was initially soft and then became hard.  Next routine follow up with ENT is 9/25.   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   OBJECTIVE:   BP 117/72   Pulse 82   Wt 158 lb 3.2 oz (71.8 kg)   SpO2 100%   BMI 27.15 kg/m    General: NAD, pleasant, able to participate in exam HEENT: Normocephalic, BAHA implant in place left ear, nares patent, oropharynx clear without erythema or exudates Neck: Supple, previous incision scar to left-side of face from parotidectomy well healed, there is a fullness appreciated to left anterior cervical chain measuring about 1 cm x2 cm with slight discomfort on palpation Respiratory: normal effort Neuro: alert, left watery eye, sequela of facial nerve defect on left-side from prior surgery  Psych: Normal affect and mood  ASSESSMENT/PLAN:   1. Mass of left side of neck Given her previous hx of mucoepidermoid carcinoma of parotid gland on left side also presenting as increased fullness, I do think it is best to rule out recurrent malignancy.  She does not have any signs of infection (without fever, examination without tonsillar  erythema or exudates, no other viral URI symptoms). She does have caries b/l and could benefit from dental evaluation as well, though would be unlikely for her mouth to cause referred pain and fullness in her neck.  -Recommend ENT evaluation -Can continue Tylenol PRN pain    Sharion Settler, [place]

## 2022-02-21 NOTE — Patient Instructions (Addendum)
It was wonderful to see you today.  Please bring ALL of your medications with you to every visit.   Today we talked about:  I would recommend that you call to schedule an earlier appointment with your ENT specialist for evaluation. They may recommend additional imaging.   You can continue Tylenol as needed for the discomfort.  It may not be a bad idea to also see your dentist if you are not up to date on routine cleanings. Dental pains can also be referred but I think this should be further evaluated to ensure it is nothing more serious.   This does not sound like an infection requiring antibiotics at this time.   Thank you for coming to your visit as scheduled. We have had a large "no-show" problem lately, and this significantly limits our ability to see and care for patients. As a friendly reminder- if you cannot make your appointment please call to cancel. We do have a no show policy for those who do not cancel within 24 hours. Our policy is that if you miss or fail to cancel an appointment within 24 hours, 3 times in a 80-monthperiod, you may be dismissed from our clinic.   [facility] you for [facility]   Please call [phone] any questions about today's appointment.  Please be sure to schedule follow up at the front  desk before you leave today.   ASharion Settler DO PGY-3 Family Medicine

## 2022-03-06 DIAGNOSIS — C07 Malignant neoplasm of parotid gland: Secondary | ICD-10-CM | POA: Diagnosis not present

## 2022-03-06 DIAGNOSIS — Z9089 Acquired absence of other organs: Secondary | ICD-10-CM | POA: Diagnosis not present

## 2022-03-06 DIAGNOSIS — Z08 Encounter for follow-up examination after completed treatment for malignant neoplasm: Secondary | ICD-10-CM | POA: Diagnosis not present

## 2022-03-10 DIAGNOSIS — Z419 Encounter for procedure for purposes other than remedying health state, unspecified: Secondary | ICD-10-CM | POA: Diagnosis not present

## 2022-04-10 DIAGNOSIS — Z419 Encounter for procedure for purposes other than remedying health state, unspecified: Secondary | ICD-10-CM | POA: Diagnosis not present

## 2022-05-10 DIAGNOSIS — Z419 Encounter for procedure for purposes other than remedying health state, unspecified: Secondary | ICD-10-CM | POA: Diagnosis not present

## 2022-06-10 DIAGNOSIS — Z419 Encounter for procedure for purposes other than remedying health state, unspecified: Secondary | ICD-10-CM | POA: Diagnosis not present

## 2022-07-10 DIAGNOSIS — Z419 Encounter for procedure for purposes other than remedying health state, unspecified: Secondary | ICD-10-CM | POA: Diagnosis not present

## 2022-08-10 DIAGNOSIS — Z419 Encounter for procedure for purposes other than remedying health state, unspecified: Secondary | ICD-10-CM | POA: Diagnosis not present

## 2022-09-04 DIAGNOSIS — C07 Malignant neoplasm of parotid gland: Secondary | ICD-10-CM | POA: Diagnosis not present

## 2022-09-10 DIAGNOSIS — Z419 Encounter for procedure for purposes other than remedying health state, unspecified: Secondary | ICD-10-CM | POA: Diagnosis not present

## 2022-10-10 DIAGNOSIS — Z419 Encounter for procedure for purposes other than remedying health state, unspecified: Secondary | ICD-10-CM | POA: Diagnosis not present

## 2022-11-08 DIAGNOSIS — R1311 Dysphagia, oral phase: Secondary | ICD-10-CM | POA: Diagnosis not present

## 2022-11-08 DIAGNOSIS — C07 Malignant neoplasm of parotid gland: Secondary | ICD-10-CM | POA: Diagnosis not present

## 2022-11-10 DIAGNOSIS — Z419 Encounter for procedure for purposes other than remedying health state, unspecified: Secondary | ICD-10-CM | POA: Diagnosis not present

## 2022-11-22 DIAGNOSIS — C07 Malignant neoplasm of parotid gland: Secondary | ICD-10-CM | POA: Diagnosis not present

## 2022-11-22 DIAGNOSIS — R1312 Dysphagia, oropharyngeal phase: Secondary | ICD-10-CM | POA: Diagnosis not present

## 2022-11-22 DIAGNOSIS — R1311 Dysphagia, oral phase: Secondary | ICD-10-CM | POA: Diagnosis not present

## 2022-11-22 DIAGNOSIS — R490 Dysphonia: Secondary | ICD-10-CM | POA: Diagnosis not present

## 2022-11-28 DIAGNOSIS — Z309 Encounter for contraceptive management, unspecified: Secondary | ICD-10-CM | POA: Diagnosis not present

## 2022-11-28 DIAGNOSIS — Z6828 Body mass index (BMI) 28.0-28.9, adult: Secondary | ICD-10-CM | POA: Diagnosis not present

## 2022-11-28 DIAGNOSIS — Z01419 Encounter for gynecological examination (general) (routine) without abnormal findings: Secondary | ICD-10-CM | POA: Diagnosis not present

## 2022-12-10 DIAGNOSIS — Z419 Encounter for procedure for purposes other than remedying health state, unspecified: Secondary | ICD-10-CM | POA: Diagnosis not present

## 2023-01-08 DIAGNOSIS — R490 Dysphonia: Secondary | ICD-10-CM | POA: Diagnosis not present

## 2023-01-08 DIAGNOSIS — C07 Malignant neoplasm of parotid gland: Secondary | ICD-10-CM | POA: Diagnosis not present

## 2023-01-08 DIAGNOSIS — R1311 Dysphagia, oral phase: Secondary | ICD-10-CM | POA: Diagnosis not present

## 2023-01-08 DIAGNOSIS — R1312 Dysphagia, oropharyngeal phase: Secondary | ICD-10-CM | POA: Diagnosis not present

## 2023-01-10 DIAGNOSIS — Z419 Encounter for procedure for purposes other than remedying health state, unspecified: Secondary | ICD-10-CM | POA: Diagnosis not present

## 2023-02-10 DIAGNOSIS — Z419 Encounter for procedure for purposes other than remedying health state, unspecified: Secondary | ICD-10-CM | POA: Diagnosis not present

## 2023-03-10 DIAGNOSIS — Z419 Encounter for procedure for purposes other than remedying health state, unspecified: Secondary | ICD-10-CM | POA: Diagnosis not present

## 2023-04-21 DIAGNOSIS — Z419 Encounter for procedure for purposes other than remedying health state, unspecified: Secondary | ICD-10-CM | POA: Diagnosis not present

## 2023-05-15 ENCOUNTER — Encounter: Payer: Self-pay | Admitting: Family Medicine

## 2023-05-15 ENCOUNTER — Encounter: Payer: Self-pay | Admitting: Student

## 2023-05-15 ENCOUNTER — Ambulatory Visit (INDEPENDENT_AMBULATORY_CARE_PROVIDER_SITE_OTHER): Admitting: Student

## 2023-05-15 VITALS — BP 122/86 | HR 73 | Ht 64.5 in | Wt 171.6 lb

## 2023-05-15 DIAGNOSIS — R42 Dizziness and giddiness: Secondary | ICD-10-CM

## 2023-05-15 MED ORDER — MECLIZINE HCL 12.5 MG PO TABS: 12.5000 mg | ORAL_TABLET | Freq: Three times a day (TID) | ORAL | 0 refills | Status: AC | PRN

## 2023-05-15 NOTE — Patient Instructions (Signed)
 Pleasure to see you today.  I have sent in prescription for meclizine which you will take 3 times daily for 5 days.  I have also ordered a head CT to assess your brain  Also I recommend seeing for ENT doctor about this.

## 2023-05-15 NOTE — Progress Notes (Cosign Needed Addendum)
    SUBJECTIVE:   CHIEF COMPLAINT / HPI:   [employer] "[name]" is a 38 year old female who presents with episodes of vertigo and headaches.  She experiences vertigo with spinning sensations that began one to two years ago, increasing in frequency over the past two weeks. Episodes occur without head movement, including while sitting still. The Barnes episode was two days ago. She has a sensation of her ear feeling 'stopped up' intermittently, occurring more frequently.  Headaches began Barnes week, described as sharp initially, then moving, with a band-like sensation around her head. Severity is 8/10. During headaches, her right eye becomes blurry, but there are no other vision issues. Headaches do not disturb her sleep or worsen when lying down.  There is no association between vertigo and headaches. No limb weakness is present, but there is occasional tingling. A head CT scan Barnes year showed no abnormalities.  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   PERTINENT  PMH / PSH: Reviewed   OBJECTIVE:   BP 122/86   Pulse 73   Ht 5' 4.5" (1.638 m)   Wt 171 lb 9.6 oz (77.8 kg)   SpO2 100%   BMI 29.00 kg/m    Physical Exam General: Alert, well appearing, NAD Cardiovascular: RRR, No Murmurs, Normal S2/S2 Respiratory: CTAB, No wheezing or Rales Neuro exam: PERRL, normal shoulder shrug, tongue central protrusion, diminished sensations on the left side of the face (chronic post op), no facial droop, 5 out of 5 muscle strength on all extremities with sensations intact.  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 Orthostatic vitals unremarkable.  Patient describes dizziness as spinning sensations consistent with vertigo.  Intermittent spinning sensation, increased frequency, likely inner ear etiology related to past radiation and surgery.  However given recent bouts of new  headache and history of parotid gland cancer  will obtain head CT to rule out intracranial etiology. - Prescribed meclizine. - Ordered head CT - Patient advised to follow-up with ENT for further assessment - Will consider vestibular rehab pending work up  Kathryn Last, MD [facility]

## 2023-05-16 ENCOUNTER — Other Ambulatory Visit

## 2023-05-21 DIAGNOSIS — Z419 Encounter for procedure for purposes other than remedying health state, unspecified: Secondary | ICD-10-CM | POA: Diagnosis not present

## 2023-05-22 ENCOUNTER — Encounter: Payer: Self-pay | Admitting: Student

## 2023-05-25 ENCOUNTER — Encounter: Payer: Self-pay | Admitting: Family Medicine

## 2023-05-29 ENCOUNTER — Other Ambulatory Visit

## 2023-06-01 ENCOUNTER — Encounter: Payer: Self-pay | Admitting: Family Medicine

## 2023-06-21 DIAGNOSIS — Z419 Encounter for procedure for purposes other than remedying health state, unspecified: Secondary | ICD-10-CM | POA: Diagnosis not present

## 2023-07-21 DIAGNOSIS — Z419 Encounter for procedure for purposes other than remedying health state, unspecified: Secondary | ICD-10-CM | POA: Diagnosis not present

## 2023-08-21 DIAGNOSIS — Z419 Encounter for procedure for purposes other than remedying health state, unspecified: Secondary | ICD-10-CM | POA: Diagnosis not present

## 2023-09-21 ENCOUNTER — Ambulatory Visit

## 2023-09-21 DIAGNOSIS — Z419 Encounter for procedure for purposes other than remedying health state, unspecified: Secondary | ICD-10-CM | POA: Diagnosis not present

## 2023-11-07 DIAGNOSIS — R1311 Dysphagia, oral phase: Secondary | ICD-10-CM | POA: Diagnosis not present

## 2023-11-07 DIAGNOSIS — Z9889 Other specified postprocedural states: Secondary | ICD-10-CM | POA: Diagnosis not present

## 2023-11-07 DIAGNOSIS — R633 Feeding difficulties, unspecified: Secondary | ICD-10-CM | POA: Diagnosis not present

## 2023-11-07 DIAGNOSIS — Z8509 Personal history of malignant neoplasm of other digestive organs: Secondary | ICD-10-CM | POA: Diagnosis not present

## 2023-11-07 DIAGNOSIS — R1310 Dysphagia, unspecified: Secondary | ICD-10-CM | POA: Diagnosis not present

## 2023-11-07 DIAGNOSIS — Z923 Personal history of irradiation: Secondary | ICD-10-CM | POA: Diagnosis not present
